# Patient Record
Sex: Female | Born: 1965
Health system: Southern US, Community
[De-identification: ages and names within clinical notes are randomized; demographics above are authoritative.]

## PROBLEM LIST (undated history)

## (undated) DIAGNOSIS — E119 Type 2 diabetes mellitus without complications: Secondary | ICD-10-CM

## (undated) DIAGNOSIS — Z309 Encounter for contraceptive management, unspecified: Secondary | ICD-10-CM

## (undated) HISTORY — DX: Type 2 diabetes mellitus without complications: E11.9

## (undated) HISTORY — DX: Encounter for contraceptive management, unspecified: Z30.9

## (undated) HISTORY — PX: NO PAST SURGERIES: SHX2092

## (undated) HISTORY — PX: FOOT SURGERY: SHX648

---

## 2001-05-21 ENCOUNTER — Other Ambulatory Visit: Admission: RE | Admit: 2001-05-21 | Discharge: 2001-05-21 | Payer: Self-pay | Admitting: Obstetrics and Gynecology

## 2006-12-28 ENCOUNTER — Ambulatory Visit (HOSPITAL_COMMUNITY): Admission: RE | Admit: 2006-12-28 | Discharge: 2006-12-28 | Payer: Self-pay | Admitting: Obstetrics and Gynecology

## 2007-08-29 ENCOUNTER — Other Ambulatory Visit: Admission: RE | Admit: 2007-08-29 | Discharge: 2007-08-29 | Payer: Self-pay | Admitting: Obstetrics and Gynecology

## 2008-01-01 ENCOUNTER — Ambulatory Visit (HOSPITAL_COMMUNITY): Admission: RE | Admit: 2008-01-01 | Discharge: 2008-01-01 | Payer: Self-pay | Admitting: Obstetrics and Gynecology

## 2008-09-08 ENCOUNTER — Other Ambulatory Visit: Admission: RE | Admit: 2008-09-08 | Discharge: 2008-09-08 | Payer: Self-pay | Admitting: Obstetrics and Gynecology

## 2009-01-02 ENCOUNTER — Ambulatory Visit (HOSPITAL_COMMUNITY): Admission: RE | Admit: 2009-01-02 | Discharge: 2009-01-02 | Payer: Self-pay | Admitting: Obstetrics and Gynecology

## 2009-09-22 ENCOUNTER — Other Ambulatory Visit: Admission: RE | Admit: 2009-09-22 | Discharge: 2009-09-22 | Payer: Self-pay | Admitting: Obstetrics and Gynecology

## 2010-01-04 ENCOUNTER — Ambulatory Visit (HOSPITAL_COMMUNITY): Admission: RE | Admit: 2010-01-04 | Discharge: 2010-01-04 | Payer: Self-pay | Admitting: Obstetrics and Gynecology

## 2010-03-28 ENCOUNTER — Emergency Department (HOSPITAL_COMMUNITY)
Admission: EM | Admit: 2010-03-28 | Discharge: 2010-03-28 | Payer: Self-pay | Source: Home / Self Care | Admitting: Family Medicine

## 2010-04-06 ENCOUNTER — Encounter
Admission: RE | Admit: 2010-04-06 | Discharge: 2010-04-06 | Payer: Self-pay | Source: Home / Self Care | Admitting: Internal Medicine

## 2010-09-23 ENCOUNTER — Ambulatory Visit (HOSPITAL_COMMUNITY)
Admission: RE | Admit: 2010-09-23 | Discharge: 2010-09-23 | Payer: Self-pay | Source: Home / Self Care | Attending: Podiatry | Admitting: Podiatry

## 2010-11-17 ENCOUNTER — Other Ambulatory Visit: Payer: Self-pay | Admitting: Adult Health

## 2010-11-17 ENCOUNTER — Other Ambulatory Visit (HOSPITAL_COMMUNITY)
Admission: RE | Admit: 2010-11-17 | Discharge: 2010-11-17 | Disposition: A | Discharge status: Home / Self Care | Payer: 59 | Source: Ambulatory Visit | Attending: Obstetrics and Gynecology | Admitting: Obstetrics and Gynecology

## 2010-11-17 DIAGNOSIS — Z01419 Encounter for gynecological examination (general) (routine) without abnormal findings: Secondary | ICD-10-CM | POA: Insufficient documentation

## 2010-11-30 ENCOUNTER — Other Ambulatory Visit: Payer: Self-pay | Admitting: Obstetrics and Gynecology

## 2010-11-30 DIAGNOSIS — Z139 Encounter for screening, unspecified: Secondary | ICD-10-CM

## 2011-01-07 ENCOUNTER — Ambulatory Visit (HOSPITAL_COMMUNITY)
Admission: RE | Admit: 2011-01-07 | Discharge: 2011-01-07 | Disposition: A | Discharge status: Home / Self Care | Payer: 59 | Source: Ambulatory Visit | Attending: Obstetrics and Gynecology | Admitting: Obstetrics and Gynecology

## 2011-01-07 DIAGNOSIS — Z1231 Encounter for screening mammogram for malignant neoplasm of breast: Secondary | ICD-10-CM | POA: Insufficient documentation

## 2011-01-07 DIAGNOSIS — Z139 Encounter for screening, unspecified: Secondary | ICD-10-CM

## 2011-11-24 ENCOUNTER — Other Ambulatory Visit (HOSPITAL_COMMUNITY)
Admission: RE | Admit: 2011-11-24 | Discharge: 2011-11-24 | Disposition: A | Discharge status: Home / Self Care | Payer: 59 | Source: Ambulatory Visit | Attending: Obstetrics and Gynecology | Admitting: Obstetrics and Gynecology

## 2011-11-24 ENCOUNTER — Other Ambulatory Visit: Payer: Self-pay | Admitting: Adult Health

## 2011-11-24 DIAGNOSIS — Z01419 Encounter for gynecological examination (general) (routine) without abnormal findings: Secondary | ICD-10-CM | POA: Insufficient documentation

## 2011-11-28 ENCOUNTER — Other Ambulatory Visit: Payer: Self-pay | Admitting: Adult Health

## 2011-11-28 DIAGNOSIS — Z139 Encounter for screening, unspecified: Secondary | ICD-10-CM

## 2012-01-09 ENCOUNTER — Ambulatory Visit (HOSPITAL_COMMUNITY)
Admission: RE | Admit: 2012-01-09 | Discharge: 2012-01-09 | Disposition: A | Discharge status: Home / Self Care | Payer: 59 | Source: Ambulatory Visit | Attending: Adult Health | Admitting: Adult Health

## 2012-01-09 DIAGNOSIS — Z139 Encounter for screening, unspecified: Secondary | ICD-10-CM

## 2012-01-09 DIAGNOSIS — Z1231 Encounter for screening mammogram for malignant neoplasm of breast: Secondary | ICD-10-CM | POA: Insufficient documentation

## 2012-11-30 ENCOUNTER — Other Ambulatory Visit: Payer: Self-pay | Admitting: Adult Health

## 2012-11-30 ENCOUNTER — Other Ambulatory Visit (HOSPITAL_COMMUNITY)
Admission: RE | Admit: 2012-11-30 | Discharge: 2012-11-30 | Disposition: A | Discharge status: Home / Self Care | Payer: 59 | Source: Ambulatory Visit | Attending: Obstetrics and Gynecology | Admitting: Obstetrics and Gynecology

## 2012-11-30 DIAGNOSIS — Z1151 Encounter for screening for human papillomavirus (HPV): Secondary | ICD-10-CM | POA: Insufficient documentation

## 2012-11-30 DIAGNOSIS — Z01419 Encounter for gynecological examination (general) (routine) without abnormal findings: Secondary | ICD-10-CM | POA: Insufficient documentation

## 2012-12-04 ENCOUNTER — Other Ambulatory Visit (HOSPITAL_COMMUNITY): Payer: Self-pay | Admitting: Internal Medicine

## 2013-01-10 ENCOUNTER — Ambulatory Visit (HOSPITAL_COMMUNITY)
Admission: RE | Admit: 2013-01-10 | Discharge: 2013-01-10 | Disposition: A | Discharge status: Home / Self Care | Payer: 59 | Source: Ambulatory Visit | Attending: Internal Medicine | Admitting: Internal Medicine

## 2013-01-10 DIAGNOSIS — Z1231 Encounter for screening mammogram for malignant neoplasm of breast: Secondary | ICD-10-CM | POA: Insufficient documentation

## 2013-01-10 DIAGNOSIS — Z139 Encounter for screening, unspecified: Secondary | ICD-10-CM

## 2013-01-21 ENCOUNTER — Telehealth: Payer: Self-pay | Admitting: Adult Health

## 2013-01-21 NOTE — Telephone Encounter (Signed)
She wanted to make sure I saw her mammogram done in Feb. It was sent to Dr. Ouida Sills,

## 2013-10-07 ENCOUNTER — Other Ambulatory Visit: Payer: Self-pay | Admitting: Adult Health

## 2013-12-09 ENCOUNTER — Other Ambulatory Visit: Payer: Self-pay | Admitting: Adult Health

## 2013-12-09 DIAGNOSIS — Z1231 Encounter for screening mammogram for malignant neoplasm of breast: Secondary | ICD-10-CM

## 2014-01-02 ENCOUNTER — Telehealth: Payer: Self-pay

## 2014-01-02 ENCOUNTER — Telehealth: Payer: Self-pay | Admitting: *Deleted

## 2014-01-02 MED ORDER — FLUCONAZOLE 150 MG PO TABS: 150.0000 mg | ORAL_TABLET | Freq: Once | ORAL | Status: DC

## 2014-01-02 NOTE — Telephone Encounter (Signed)
Has appt in April will ant labs later

## 2014-01-02 NOTE — Telephone Encounter (Signed)
?  yeast after antibiotics requests diflucan

## 2014-01-13 ENCOUNTER — Ambulatory Visit (HOSPITAL_COMMUNITY)
Admission: RE | Admit: 2014-01-13 | Discharge: 2014-01-13 | Disposition: A | Discharge status: Home / Self Care | Payer: 59 | Source: Ambulatory Visit | Attending: Adult Health | Admitting: Adult Health

## 2014-01-13 DIAGNOSIS — Z1231 Encounter for screening mammogram for malignant neoplasm of breast: Secondary | ICD-10-CM

## 2014-01-23 ENCOUNTER — Other Ambulatory Visit: Payer: Self-pay | Admitting: Adult Health

## 2014-01-23 ENCOUNTER — Encounter: Payer: Self-pay | Admitting: Adult Health

## 2014-01-23 ENCOUNTER — Ambulatory Visit (INDEPENDENT_AMBULATORY_CARE_PROVIDER_SITE_OTHER): Payer: 59 | Admitting: Adult Health

## 2014-01-23 VITALS — BP 140/82 | HR 74 | Ht 63.0 in | Wt 127.0 lb

## 2014-01-23 DIAGNOSIS — Z309 Encounter for contraceptive management, unspecified: Secondary | ICD-10-CM

## 2014-01-23 DIAGNOSIS — Z01419 Encounter for gynecological examination (general) (routine) without abnormal findings: Secondary | ICD-10-CM

## 2014-01-23 DIAGNOSIS — Z1212 Encounter for screening for malignant neoplasm of rectum: Secondary | ICD-10-CM

## 2014-01-23 HISTORY — DX: Encounter for contraceptive management, unspecified: Z30.9

## 2014-01-23 LAB — HEMOCCULT GUIAC POC 1CARD (OFFICE): Fecal Occult Blood, POC: NEGATIVE

## 2014-01-23 MED ORDER — NORGESTREL-ETHINYL ESTRADIOL 0.5-50 MG-MCG PO TABS: ORAL_TABLET | ORAL | Status: DC

## 2014-01-23 NOTE — Patient Instructions (Signed)
Physical in 1 year Pap in 2017 Mammogram yearly Colonoscopy at 51 Labs per PCP

## 2014-01-23 NOTE — Progress Notes (Signed)
Patient ID: Shannon Miles, female   DOB: March 19, 1966, 48 y.o.   MRN: [id number] History of Present Illness: Shannon Miles is a 48 year old white female, married in for a physical.She had a normal pap with negative  HPV 11/30/12.She works for [employer] but has changed jobs and not loving it, says she will retire at 3655 from there.   Current Medications, Allergies, Past Medical History, Past Surgical History, Family History and Social History were reviewed in [facility] electronic medical record.     Review of Systems: Patient denies any headaches, blurred vision, shortness of breath, chest pain, abdominal pain, problems with bowel movements, urination, or intercourse. No joint pain or mood swings.Periods are light and last about 1 day.   Physical Exam:BP 140/82  Pulse 74  Ht 5\' 3"  (1.6 m)  Wt 127 lb (57.607 kg)  BMI 22.50 kg/m2  LMP 01/06/2014 General:  Well developed, well nourished, no acute distress Skin:  Warm and dry Neck:  Midline trachea, normal thyroid Lungs; Clear to auscultation bilaterally Breast:  No dominant palpable mass, retraction, or nipple discharge Cardiovascular: Regular rate and rhythm Abdomen:  Soft, non tender, no hepatosplenomegaly Pelvic:  External genitalia is normal in appearance.  The vagina is normal in appearance. The cervix is bulbous.  Uterus is felt to be normal size, shape, and contour.  No adnexal masses or tenderness noted. Rectal: Good sphincter tone, no polyps, or hemorrhoids felt.  Hemoccult negative. Extremities:  No swelling or varicosities noted Psych:  No mood changes, alert and cooperative, seems happy Discussed can stay on OCs til 53 will stop and be off 1 month then check FSH.  Impression: Yearly gyn exam no pap Contraceptive management    Plan: Physical in 1 year Pap in 2017 Mammogram yearly  Colonoscopy at 51  Labs per PCP Refilled Ogestrel x 1 year

## 2014-01-31 ENCOUNTER — Other Ambulatory Visit: Payer: Self-pay | Admitting: Adult Health

## 2014-05-09 ENCOUNTER — Ambulatory Visit (HOSPITAL_COMMUNITY)
Admission: RE | Admit: 2014-05-09 | Discharge: 2014-05-09 | Disposition: A | Discharge status: Home / Self Care | Payer: 59 | Source: Ambulatory Visit | Attending: Internal Medicine | Admitting: Internal Medicine

## 2014-05-09 DIAGNOSIS — M6281 Muscle weakness (generalized): Secondary | ICD-10-CM | POA: Diagnosis not present

## 2014-05-09 DIAGNOSIS — G5700 Lesion of sciatic nerve, unspecified lower limb: Secondary | ICD-10-CM | POA: Insufficient documentation

## 2014-05-09 DIAGNOSIS — M25559 Pain in unspecified hip: Secondary | ICD-10-CM | POA: Insufficient documentation

## 2014-05-09 DIAGNOSIS — M25659 Stiffness of unspecified hip, not elsewhere classified: Secondary | ICD-10-CM | POA: Diagnosis not present

## 2014-05-09 DIAGNOSIS — M5430 Sciatica, unspecified side: Secondary | ICD-10-CM | POA: Diagnosis not present

## 2014-05-09 DIAGNOSIS — R269 Unspecified abnormalities of gait and mobility: Secondary | ICD-10-CM | POA: Insufficient documentation

## 2014-05-09 DIAGNOSIS — IMO0001 Reserved for inherently not codable concepts without codable children: Secondary | ICD-10-CM | POA: Diagnosis present

## 2014-05-09 DIAGNOSIS — M25652 Stiffness of left hip, not elsewhere classified: Secondary | ICD-10-CM

## 2014-05-09 DIAGNOSIS — G5702 Lesion of sciatic nerve, left lower limb: Secondary | ICD-10-CM | POA: Insufficient documentation

## 2014-05-09 DIAGNOSIS — M25552 Pain in left hip: Secondary | ICD-10-CM

## 2014-05-09 DIAGNOSIS — M5432 Sciatica, left side: Secondary | ICD-10-CM

## 2014-05-09 NOTE — Evaluation (Signed)
Physical Therapy Evaluation  Patient Details  Name: Shannon RabonMelissa S Miles MRN: [id number] Date of Birth: 1966/08/08  Today's Date: 05/09/2014 Time: [id number] PT Time Calculation (min): 45 min    Charges: 1 Eval, Manual 1625-1633, TherEx [id number]          Visit#: 1 of 17  Re-eval: 06/08/14 Assessment Diagnosis: Sciatica, possible piriformis syndrome Next MD Visit: Ouida SillsFagan:  Prior Therapy: no  Authorization: [employer]    Authorization Time Period:    Authorization Visit#:   of     Past Medical History:  Past Medical History  Diagnosis Date  . Diabetes mellitus without complication   . Contraceptive management 01/23/2014   Past Surgical History: No past surgical history on file.  Subjective Symptoms/Limitations Symptoms: numbness and tingling in Lt LE occasionally most with exercisesw Pertinent History: puuling a muscle in her hip and feelign a pop, 1 year ago, patient was bit by dog. Patient has most troubkle with performing pilates and treadmill. Diabtes: controlled with diet and exercise. Patient recently order ellyptical Limitations: Sitting;Walking;Standing How long can you sit comfortably?: <3610minutes How long can you walk comfortably?: no difficulty Patient Stated Goals: To be able to run and do pilates without pain Pain Assessment Currently in Pain?: Yes Pain Score: 1  Pain Location: Hip Pain Orientation: Posterior;Distal Pain Type: Chronic pain Pain Radiating Towards: From Lt hip to Lt foot Pain Onset: More than a month ago Pain Frequency: Intermittent Pain Relieving Factors: decreasing inensity of exercise, decreasing grade of treadmilltaken  Effect of Pain on Daily Activities: Running, pilates, and sittign at work  Cognition/Observation Observation/Other Assessments Observations: Gait: excessive toe out, good foot mechanics, possible early heel rise Other Assessments: 3D hip Excursion: tightness with Lt frontal plane sway., and limited hip  extension  Sensation/Coordination/Flexibility/Functional Tests Flexibility Thomas: Positive Obers: Positive  Assessment LLE AROM (degrees) Left Hip External Rotation : 44 Left Hip Internal Rotation : 40 Left Ankle Dorsiflexion: 9 (12 Rt) LLE Strength Left Hip Flexion: 5/5 Left Hip Extension: 4/5 Left Hip External Rotation : 44 Left Hip Internal Rotation : 40 Left Hip ABduction: 4/5 Left Knee Flexion: 3+/5 (painful) Left Knee Extension: 5/5 Left Ankle Dorsiflexion: 5/5  Exercise/Treatments Mobility/Balance  Ambulation/Gait Curb: 7: Independent   Stretches Piriformis stretch 4x 15seconds 3 ways ITband stretch 5x 10 secondsto 6" step   other manual therapy: neural flossing of sciatic nerve  Physical Therapy Assessment and Plan PT Assessment and Plan Clinical Impression Statement: Patient displays signs and symptoms consistent with Lt LE sciatica and piriformis syndrome includign Lt LE hip weakness, and stiffness, and limited piriformis muscle mobility resulting in abnormal gait while walkign and running. Patient displayed decreased pain following neural flossing technique and piriformis muscle stretchresulting in improved pain while sitting. patient  will benefit from skilled phsyial therapy to restor normal hip mobility and strength so patient can return to work with less pain and regular running routine to improve her control oftype 2 diabetes.  Pt will benefit from skilled therapeutic intervention in order to improve on the following deficits: Abnormal gait;Decreased activity tolerance;Decreased strength;Impaired flexibility;Pain;Increased muscle spasms;Increased fascial restricitons Rehab Potential: Good Clinical Impairments Affecting Rehab Potential: highly motivated, recent injury PT Frequency: Min 2X/week PT Duration: 8 weeks PT Treatment/Interventions: Gait training;Stair training;Therapeutic exercise;Balance training;Modalities;Manual techniques;Patient/family  education PT Plan: initial focus on restoring mobility of hamstrings, piriformis, tensor fascia latae, and rectus femoris/hip flexors to normalize gait/posture. As pain and mobnility improves therapy to shift focus to increasing LE strength and stability so patient can return to runnign  pain free.     Goals Home Exercise Program Pt/caregiver will Perform Home Exercise Program: For increased ROM;For increased strengthening;Independently PT Goal: Perform Home Exercise Program - Progress: Goal set today PT Short Term Goals Time to Complete Short Term Goals: 4 weeks PT Short Term Goal 1: Patient will display a negative thomas test indicating improved stride length durign walking and ruunning PT Short Term Goal 2: Patient will display 20 degrees ankle dorsiflexion indicating decreased early heel off during gait. PT Short Term Goal 3: Patient will display a negative obers test indicating increased tensor fascia latae and glut med/min mobility PT Short Term Goal 4: Patient will display a negative piriformis test indicating imptoved moscle mobility and decreased strain on sciatic nerve PT Short Term Goal 5: Patient will diisplay a negative neural tension test for sciative nerve indicating improved nervsous tissue mobility so patient can walk and sit >1 hour without pain.  PT Long Term Goals Time to Complete Long Term Goals: 8 weeks PT Long Term Goal 1: Patient will display  improved glut med/max strengthe of 5/5 MMt indicatign improved hip strength and stability so patient can return to running.  PT Long Term Goal 2: Patient will display increased hamstring strength of 5/5 manual muscle test so patient can return to running >79miles per workout Long Term Goal 3: Patient will demosntrate equal frontal plane single leg balance reqaches withing 5cm indicaqtign improved hip, kne and ankle stability on Lt vs Rt.   Problem List Patient Active Problem List   Diagnosis Date Noted  . Sciatica 05/09/2014  .  Piriformis syndrome of left side 05/09/2014  . Stiffness of joint of left pelvic region and thigh 05/09/2014  . Pain in joint, pelvic region and thigh 05/09/2014  . Contraceptive management 01/23/2014    PT - End of Session Activity Tolerance: Patient tolerated treatment well General Behavior During Therapy: [facility] for tasks assessed/performed PT Plan of Care PT Home Exercise Plan: Piriformis and ITband stretches4x 15 seconds  GP    Price Lachapelle R 05/09/2014, 8:05 PM  Physician Documentation Your signature is required to indicate approval of the treatment plan as stated above.  Please sign and either send electronically or make a copy of this report for your files and return this physician signed original.   Please mark one 1.__approve of plan  2. ___approve of plan with the following conditions.   ______________________________                                                          _____________________ Physician Signature                                                                                                             Date

## 2014-05-27 ENCOUNTER — Ambulatory Visit (HOSPITAL_COMMUNITY): Payer: 59 | Admitting: Physical Therapy

## 2014-05-27 ENCOUNTER — Ambulatory Visit (HOSPITAL_COMMUNITY)
Admission: RE | Admit: 2014-05-27 | Discharge: 2014-05-27 | Disposition: A | Discharge status: Home / Self Care | Payer: 59 | Source: Ambulatory Visit | Attending: Internal Medicine | Admitting: Internal Medicine

## 2014-05-27 DIAGNOSIS — IMO0001 Reserved for inherently not codable concepts without codable children: Secondary | ICD-10-CM | POA: Diagnosis present

## 2014-05-27 DIAGNOSIS — M5430 Sciatica, unspecified side: Secondary | ICD-10-CM | POA: Insufficient documentation

## 2014-05-27 DIAGNOSIS — M25659 Stiffness of unspecified hip, not elsewhere classified: Secondary | ICD-10-CM | POA: Diagnosis not present

## 2014-05-27 DIAGNOSIS — G5700 Lesion of sciatic nerve, unspecified lower limb: Secondary | ICD-10-CM | POA: Insufficient documentation

## 2014-05-27 DIAGNOSIS — M25559 Pain in unspecified hip: Secondary | ICD-10-CM | POA: Diagnosis not present

## 2014-05-27 DIAGNOSIS — M6281 Muscle weakness (generalized): Secondary | ICD-10-CM | POA: Insufficient documentation

## 2014-05-27 DIAGNOSIS — R269 Unspecified abnormalities of gait and mobility: Secondary | ICD-10-CM | POA: Insufficient documentation

## 2014-05-27 NOTE — Progress Notes (Signed)
Physical Therapy Treatment Patient Details  Name: Shannon Miles MRN: [id number] Date of Birth: 02/26/1966  Today's Date: 05/27/2014 Time: 4098-1191 PT Time Calculation (min): 40 min  Charges: TherEx [id number] Visit#: 2 of 17  Re-eval: 06/08/14 Assessment Diagnosis: Sciatica, possible piriformis syndrome Next MD Visit: Fagan:  Prior Therapy: no  Authorization: [employer]   Subjective: Symptoms/Limitations Symptoms: Patient5 reports feeling much better noting that she no longer feels as tight and has much less pain. notes contionuned pain only with prolonged sitting and no longer having pain during any pilates exercises  Pain Assessment Currently in Pain?: No/denies Pain Score: 0-No pain  Exercise/Treatments Stretches Active Hamstring Stretch: Limitations Active Hamstring Stretch Limitations: 10x 3 seconds to 14" box Hip Flexor Stretch: Limitations Hip Flexor Stretch Limitations: 14" 3 way 10x 3" ITB Stretch: Limitations ITB Stretch Limitations: 10x 3" to 8" box Piriformis Stretch: Limitations Piriformis Stretch Limitations: 10x 3' Gastroc Stretch: Limitations Gastroc Stretch Limitations: 10x 3"  Standing Functional Squat: Limitations Functional Squat Limitations: SFT squat, no frontal plane tweak 5x each Other Standing Knee Exercises: 2 way groin stretch 10x 3 seconds Other Standing Knee Exercises: Squat walk around 10x   Physical Therapy Assessment and Plan PT Assessment and Plan Clinical Impression Statement: Patient demsontrats good technique with all stretches requiring mnimal cuing . stretches added to HEP.  Initiated low level strengtheing with patient displaying no pain.   PT Plan: continue focus on restoring mobility of hamstrings, piriformis, tensor fascia latae, and rectus femoris/hip flexors to normalize gait/posture and slow introduction of strengtheing exercises    Goals PT Short Term Goals PT Short Term Goal 1: Patient will display a negative thomas test  indicating improved stride length durign walking and ruunning PT Short Term Goal 1 - Progress: Progressing toward goal PT Short Term Goal 2: Patient will display 20 degrees ankle dorsiflexion indicating decreased early heel off during gait. PT Short Term Goal 2 - Progress: Progressing toward goal PT Short Term Goal 3: Patient will display a negative obers test indicating increased tensor fascia latae and glut med/min mobility PT Short Term Goal 3 - Progress: Progressing toward goal PT Short Term Goal 4: Patient will display a negative piriformis test indicating imptoved moscle mobility and decreased strain on sciatic nerve PT Short Term Goal 4 - Progress: Progressing toward goal PT Short Term Goal 5: Patient will diisplay a negative neural tension test for sciative nerve indicating improved nervsous tissue mobility so patient can walk and sit >1 hour without pain.  PT Short Term Goal 5 - Progress: Progressing toward goal PT Long Term Goals PT Long Term Goal 1: Patient will display  improved glut med/max strengthe of 5/5 MMt indicatign improved hip strength and stability so patient can return to running.  PT Long Term Goal 1 - Progress: Progressing toward goal PT Long Term Goal 2: Patient will display increased hamstring strength of 5/5 manual muscle test so patient can return to running >63miles per workout PT Long Term Goal 2 - Progress: Progressing toward goal Long Term Goal 3: Patient will demosntrate equal frontal plane single leg balance reqaches withing 5cm indicating improved hip, kne and ankle stability on Lt vs Rt.  Long Term Goal 3 Progress: Progressing toward goal  Problem List Patient Active Problem List   Diagnosis Date Noted  . Sciatica 05/09/2014  . Piriformis syndrome of left side 05/09/2014  . Stiffness of joint of left pelvic region and thigh 05/09/2014  . Pain in joint, pelvic region and thigh 05/09/2014  .  Contraceptive management 01/23/2014    PT - End of  Session Activity Tolerance: Patient tolerated treatment well General Behavior During Therapy: [facility] for tasks assessed/performed  GP    Uva Runkel R 05/27/2014, 4:53 PM

## 2014-05-29 ENCOUNTER — Ambulatory Visit (HOSPITAL_COMMUNITY)
Admission: RE | Admit: 2014-05-29 | Discharge: 2014-05-29 | Disposition: A | Discharge status: Home / Self Care | Payer: 59 | Source: Ambulatory Visit | Attending: Internal Medicine | Admitting: Internal Medicine

## 2014-05-29 DIAGNOSIS — IMO0001 Reserved for inherently not codable concepts without codable children: Secondary | ICD-10-CM | POA: Diagnosis not present

## 2014-05-29 NOTE — Progress Notes (Addendum)
Physical Therapy Treatment Patient Details  Name: Shannon Miles MRN: [id number] Date of Birth: 01/07/1966  Today's Date: 05/29/2014 Time:  1600- 1645      Chargees: TherEx [id number], Manual [id number] Visit#: 3 of 17  Re-eval: 06/08/14 Assessment Diagnosis: Sciatica, possible piriformis syndrome Next MD Visit: Fagan:  Prior Therapy: no  Subjective: Symptoms/Limitations Symptoms: Patient reports HEP adhearance no pain and is feeling good.  Pain Assessment Currently in Pain?: No/denies  Exercise/Treatments Stretches Active Hamstring Stretch: Limitations Active Hamstring Stretch Limitations: 5x 3 seconds to 14" box Hip Flexor Stretch: Limitations Hip Flexor Stretch Limitations: 14" 3 way 5x 3" ITB Stretch: Limitations ITB Stretch Limitations: 10x 3" to 8" box Piriformis Stretch: Limitations Piriformis Stretch Limitations: 3way 10x 3' Gastroc Stretch: Limitations Gastroc Stretch Limitations: 5x 3"  Standing Forward Lunges: Limitations Forward Lunges Limitations: Lunge matrix 5x  Functional Squat Limitations: SFT squat, no frontal plane tweak 5x each Other Standing Knee Exercises: Sumo walk with red t-band 415ft 2x round trib Other Standing Knee Exercises: Squat walk around 10x  Manual Therapy Manual Therapy: Myofascial release Myofascial Release: piriformis release  Physical Therapy Assessment and Plan PT Assessment and Plan Clinical Impression Statement: Patient demsontrates good technique with all stretches requiring minimal cuing. Patient demonstrates good tolerance to progressed strengthening exercises. no pain noted throughout session. Manual therapy performed this session to ffurther progress pirifomris muscle mobility with patient stating "that felt great!" PT Plan: continue focus on restoring mobility of hamstrings, piriformis, tensor fascia latae, and rectus femoris/hip flexors to normalize gait/posture and slow introduction of strengtheing exercises. Add 3D step  ups and single leg balance reach matrix    Problem List Patient Active Problem List   Diagnosis Date Noted  . Sciatica 05/09/2014  . Piriformis syndrome of left side 05/09/2014  . Stiffness of joint of left pelvic region and thigh 05/09/2014  . Pain in joint, pelvic region and thigh 05/09/2014  . Contraceptive management 01/23/2014    PT - End of Session Activity Tolerance: Patient tolerated treatment well General Behavior During Therapy: [facility] for tasks assessed/performed PT Plan of Care PT Home Exercise Plan: Piriformis and ITband stretches 10x 3seconds and squat walk around daily, hip flexor, calf, hamstring, groin every other day 10x 3seconds, SFT squat nuetral frontal plane 5x, lunge matrix 5x, sumo walk with blue T-band 3240ft each everyother day  GP    Latitia Housewright R 05/29/2014, 5:52 PM

## 2014-06-03 ENCOUNTER — Ambulatory Visit (HOSPITAL_COMMUNITY): Payer: 59 | Admitting: Physical Therapy

## 2014-06-03 ENCOUNTER — Ambulatory Visit (HOSPITAL_COMMUNITY)
Admission: RE | Admit: 2014-06-03 | Discharge: 2014-06-03 | Disposition: A | Discharge status: Home / Self Care | Payer: 59 | Source: Ambulatory Visit | Attending: Internal Medicine | Admitting: Internal Medicine

## 2014-06-03 DIAGNOSIS — IMO0001 Reserved for inherently not codable concepts without codable children: Secondary | ICD-10-CM | POA: Diagnosis not present

## 2014-06-03 NOTE — Progress Notes (Signed)
Physical Therapy Treatment Patient Details  Name: Shannon Miles MRN: [id number] Date of Birth: [birth year]  Today's Date: 06/03/2014 Time: 4098-1191 PT Time Calculation (min): 45 min   Charges: [id number] TE Visit#: 4 of 17  Re-eval: 06/08/14 Assessment Diagnosis: Sciatica, possible piriformis syndrome Next MD Visit: Ouida Sills:  Prior Therapy: no  Authorization: [employer]  Subjective: Symptoms/Limitations Symptoms: Patient states increased soreness after last session, no pain following.  Pain Assessment Currently in Pain?: No/denies  Exercise/Treatments Stretches Active Hamstring Stretch: Limitations Active Hamstring Stretch Limitations: 5x 3 seconds to 14" box Hip Flexor Stretch: Limitations Hip Flexor Stretch Limitations: 14" 3 way 5x 3" ITB Stretch: Limitations ITB Stretch Limitations: 10x 3" to 8" box Piriformis Stretch: Limitations Piriformis Stretch Limitations: 3way 10x 3' Gastroc Stretch: Limitations Gastroc Stretch Limitations: 5x 3"  Standing Forward Lunges: Limitations Forward Lunges Limitations: Lunge matrix common and uncommon 5x  Lateral Step Up: Limitations;Both;10 reps Lateral Step Up Limitations: 12" Forward Step Up: Both;Limitations;10 reps Forward Step Up Limitations: 12" Functional Squat Limitations: SFT squat, no frontal plane tweak 5x each 5lb dumbbell SLS: Single leg balance reach matrix common 5x Other Standing Knee Exercises: Sumo walk with green t-band 15ft 2x round trib Other Standing Knee Exercises: Squat walk around 10x   Physical Therapy Assessment and Plan PT Assessment and Plan Clinical Impression Statement: Patient is making great progress, bot contineud to demosntrate glut weakness on LT LE greater that Rt as indicateed by decreased stability durign single leg balance reach activity. no pain throughout session. Noted limited depth throughtou lunge matrix due to functional weakness. PT Plan: continue focus on restoring mobility of hamstrings,  piriformis, tensor fascia latae, and rectus femoris/hip flexors to normalize gait/posture and slow introduction of strengtheing exercises. Add 3D step ups and single leg balance reach matrix    Goals PT Short Term Goals PT Short Term Goal 1: Patient will display a negative thomas test indicating improved stride length durign walking and ruunning PT Short Term Goal 2: Patient will display 20 degrees ankle dorsiflexion indicating decreased early heel off during gait. PT Short Term Goal 2 - Progress: Progressing toward goal PT Short Term Goal 3: Patient will display a negative obers test indicating increased tensor fascia latae and glut med/min mobility PT Short Term Goal 3 - Progress: Progressing toward goal PT Short Term Goal 4: Patient will display a negative piriformis test indicating imptoved moscle mobility and decreased strain on sciatic nerve PT Short Term Goal 4 - Progress: Progressing toward goal PT Short Term Goal 5: Patient will diisplay a negative neural tension test for sciative nerve indicating improved nervsous tissue mobility so patient can walk and sit >1 hour without pain.  PT Short Term Goal 5 - Progress: Progressing toward goal  Problem List Patient Active Problem List   Diagnosis Date Noted  . Sciatica 05/09/2014  . Piriformis syndrome of left side 05/09/2014  . Stiffness of joint of left pelvic region and thigh 05/09/2014  . Pain in joint, pelvic region and thigh 05/09/2014  . Contraceptive management 01/23/2014    PT - End of Session Activity Tolerance: Patient tolerated treatment well General Behavior During Therapy: [facility] for tasks assessed/performed  GP    Yuriy Cui R 06/03/2014, 4:57 PM

## 2014-06-05 ENCOUNTER — Ambulatory Visit (HOSPITAL_COMMUNITY)
Admission: RE | Admit: 2014-06-05 | Discharge: 2014-06-05 | Disposition: A | Discharge status: Home / Self Care | Payer: 59 | Source: Ambulatory Visit | Attending: Internal Medicine | Admitting: Internal Medicine

## 2014-06-05 ENCOUNTER — Ambulatory Visit (HOSPITAL_COMMUNITY): Payer: 59 | Admitting: Physical Therapy

## 2014-06-05 DIAGNOSIS — IMO0001 Reserved for inherently not codable concepts without codable children: Secondary | ICD-10-CM | POA: Diagnosis not present

## 2014-06-05 NOTE — Progress Notes (Signed)
Physical Therapy Treatment Patient Details  Name: Shannon Miles MRN: [id number] Date of Birth: 10/12/1965  Today's Date: 06/05/2014 Time: 4098-1191 PT Time Calculation (min): 45 min    Charges: TE [id number] Visit#: 5 of 17  Re-eval: 06/08/14 Assessment Diagnosis: Sciatica, possible piriformis syndrome Next MD Visit: Ouida Sills:  Prior Therapy: no  Authorization: [employer]   Subjective: Symptoms/Limitations Symptoms: Patient styates she is feeling much bettrer, and increased the grade on her treadmill to 5 degrees noting no pain what so ever. Patient states feeling really good and loose following her run berfore therapy Pain Assessment Currently in Pain?: No/denies Pain Score: 0-No pain  Exercise/Treatments Stretches Active Hamstring Stretch: Limitations Active Hamstring Stretch Limitations: 5x 3 seconds to 14" box ITB Stretch: Limitations ITB Stretch Limitations: 10x 3" to 8" box Piriformis Stretch: Limitations Piriformis Stretch Limitations: 3way 10x 3' Gastroc Stretch: Limitations Gastroc Stretch Limitations: 5x 3"  Standing Forward Lunges: Limitations Forward Lunges Limitations: Lunge matrix common and uncommon 5x ith 5lb dumbbell Lateral Step Up: Limitations;Both;10 reps Lateral Step Up Limitations: 12" 10x also transverse plane step up 10x Forward Step Up: Both;Limitations;10 reps Forward Step Up Limitations: 12" Functional Squat Limitations: single leg toe touch squat reach matrix 5x with 5lb dumbbell to 18"  SLS: Single leg balance reach matrix 5x from 2" Other Standing Knee Exercises: Sumo walk and monster walk  with green t-band 36ft 2x round trib    Physical Therapy Assessment and Plan PT Assessment and Plan Clinical Impression Statement: Patient conitnues to progress well with improving strength and ability to performd squat/lunge/step ups with improved control through increased depth. o pain throughtou session. Patient conitnues to have limited depth with lunging  though depth has improved.  PT Plan: continue focus on restoring mobility piriformis, and tensor fascia latae to normalize gait/posture, progress lunges to do from a 2" box, increase step up height, introduce 6" retro 2way chop squat, introduece speed warm up introduce UE and LE ground matrix    Goals PT Short Term Goals PT Short Term Goal 1: Patient will display a negative thomas test indicating improved stride length durign walking and ruunning PT Short Term Goal 1 - Progress: Progressing toward goal PT Short Term Goal 2: Patient will display 20 degrees ankle dorsiflexion indicating decreased early heel off during gait. PT Short Term Goal 2 - Progress: Progressing toward goal PT Short Term Goal 3: Patient will display a negative obers test indicating increased tensor fascia latae and glut med/min mobility PT Short Term Goal 3 - Progress: Progressing toward goal PT Short Term Goal 4: Patient will display a negative piriformis test indicating imptoved moscle mobility and decreased strain on sciatic nerve PT Short Term Goal 4 - Progress: Progressing toward goal PT Short Term Goal 5: Patient will diisplay a negative neural tension test for sciative nerve indicating improved nervsous tissue mobility so patient can walk and sit >1 hour without pain.  PT Short Term Goal 5 - Progress: Progressing toward goal  Problem List Patient Active Problem List   Diagnosis Date Noted  . Sciatica 05/09/2014  . Piriformis syndrome of left side 05/09/2014  . Stiffness of joint of left pelvic region and thigh 05/09/2014  . Pain in joint, pelvic region and thigh 05/09/2014  . Contraceptive management 01/23/2014    PT - End of Session Activity Tolerance: Patient tolerated treatment well General Behavior During Therapy: [facility] for tasks assessed/performed  GP    Divonte Senger R 06/05/2014, 5:07 PM

## 2014-06-10 ENCOUNTER — Ambulatory Visit (HOSPITAL_COMMUNITY): Payer: 59 | Admitting: Physical Therapy

## 2014-06-10 ENCOUNTER — Ambulatory Visit (HOSPITAL_COMMUNITY)
Admission: RE | Admit: 2014-06-10 | Discharge: 2014-06-10 | Disposition: A | Discharge status: Home / Self Care | Payer: 59 | Source: Ambulatory Visit | Attending: Internal Medicine | Admitting: Internal Medicine

## 2014-06-10 DIAGNOSIS — M25659 Stiffness of unspecified hip, not elsewhere classified: Secondary | ICD-10-CM | POA: Diagnosis not present

## 2014-06-10 DIAGNOSIS — R269 Unspecified abnormalities of gait and mobility: Secondary | ICD-10-CM | POA: Diagnosis not present

## 2014-06-10 DIAGNOSIS — M5430 Sciatica, unspecified side: Secondary | ICD-10-CM | POA: Diagnosis not present

## 2014-06-10 DIAGNOSIS — M25559 Pain in unspecified hip: Secondary | ICD-10-CM | POA: Insufficient documentation

## 2014-06-10 DIAGNOSIS — M6281 Muscle weakness (generalized): Secondary | ICD-10-CM | POA: Diagnosis not present

## 2014-06-10 DIAGNOSIS — G5700 Lesion of sciatic nerve, unspecified lower limb: Secondary | ICD-10-CM | POA: Insufficient documentation

## 2014-06-10 DIAGNOSIS — IMO0001 Reserved for inherently not codable concepts without codable children: Secondary | ICD-10-CM | POA: Diagnosis not present

## 2014-06-10 NOTE — Evaluation (Addendum)
Physical Therapy Re-assesment  Patient Details  Name: Shannon Miles MRN: [id number] Date of Birth: 04-Apr-1966  Today's Date: 06/10/2014 Time: 3295-1884 PT Time Calculation (min): 45 min     Charges: 1660-6301 TE         Visit#: 6 of 17  Re-eval: 07/10/14 Assessment Diagnosis: Sciatica, possible piriformis syndrome Next MD Visit: Willey Blade:  Prior Therapy: no  Authorization: [employer]     Past Medical History:  Past Medical History  Diagnosis Date  . Diabetes mellitus without complication   . Contraceptive management 01/23/2014   Past Surgical History: No past surgical history on file.  Subjective Symptoms/Limitations Symptoms: Patient states she has conitnued to improve with increase grade on treadmill and is running on hills without difficulty Pertinent History: puuling a muscle in her hip and feeling a pop, 1 year ago, patient was bit by dog. Patient has most troubkle with performing pilates and treadmill. Diabtes: controlled with diet and exercise. Patient recently order ellyptical Pain Assessment Currently in Pain?: No/denies Pain Score: 0-No pain  Sensation/Coordination/Flexibility/Functional Tests Flexibility Thomas: Negative Obers: Negative Functional Tests Functional Tests: FOTO: was 12% limited, now 2% limited   Assessment LLE AROM (degrees) Left Ankle Dorsiflexion: 20 (12 Rt) LLE Strength Left Hip Extension: 4/5 Left Hip ABduction: 4/5 Left Knee Flexion:  (4+/5)  Exercise/Treatments Stretches Active Hamstring Stretch: Limitations Active Hamstring Stretch Limitations: 5x 3 seconds to 8" box 3D ITB Stretch: Limitations ITB Stretch Limitations: 10x 3" to 8" box Piriformis Stretch: Limitations Piriformis Stretch Limitations: 3way 10x 3' Gastroc Stretch: Limitations Gastroc Stretch Limitations: 5x 3"  Standing Functional Squat Limitations: 12" retro single leg toe touch squat 2way chop with Blue ball SLS: Single leg balance reach matrix 5x from 2" Other  Standing Knee Exercises: UE and LE ground matrix 5x  Physical Therapy Assessment and Plan PT Assessment and Plan Clinical Impression Statement: Patient is making great progress towards all goals with only remainign deficits being limited glut med/max strength. Patient displays significantly improved mobility in bilateral LE with improved gait. Expect patient to conitnue to benefit from 2 more weeks of phsyical therapy to reach remaining long term goals then discharge with HEP to further progress running.  PT Plan: continue focus on restoring mobility piriformis, and tensor fascia latae to normalize gait/posture, progress lunges to do from a 2" box, increase step up height. Introduce speed warm-up.    Goals PT Short Term Goals PT Short Term Goal 1: Patient will display a negative thomas test indicating improved stride length durign walking and ruunning PT Short Term Goal 1 - Progress: Met PT Short Term Goal 2: Patient will display 20 degrees ankle dorsiflexion indicating decreased early heel off during gait. PT Short Term Goal 2 - Progress: Met PT Short Term Goal 3: Patient will display a negative obers test indicating increased tensor fascia latae and glut med/min mobility PT Short Term Goal 3 - Progress: Met PT Short Term Goal 4: Patient will display a negative piriformis test indicating imptoved moscle mobility and decreased strain on sciatic nerve PT Short Term Goal 4 - Progress: Met PT Short Term Goal 5: Patient will diisplay a negative neural tension test for sciative nerve indicating improved nervsous tissue mobility so patient can walk and sit >1 hour without pain.  PT Short Term Goal 5 - Progress: Met PT Long Term Goals PT Long Term Goal 1: Patient will display  improved glut med/max strengthe of 5/5 MMt indicatign improved hip strength and stability so patient can return to running.  PT Long Term Goal 1 - Progress: Progressing toward goal PT Long Term Goal 2: Patient will display  increased hamstring strength of 5/5 manual muscle test so patient can return to running >70mles per workout PT Long Term Goal 2 - Progress: Progressing toward goal Long Term Goal 3: Patient will demosntrate equal frontal plane single leg balance reqaches withing 5cm indicating improved hip, kne and ankle stability on Lt vs Rt.  Long Term Goal 3 Progress: Progressing toward goal  Problem List Patient Active Problem List   Diagnosis Date Noted  . Sciatica 05/09/2014  . Piriformis syndrome of left side 05/09/2014  . Stiffness of joint of left pelvic region and thigh 05/09/2014  . Pain in joint, pelvic region and thigh 05/09/2014  . Contraceptive management 01/23/2014    PT - End of Session Activity Tolerance: Patient tolerated treatment well General Behavior During Therapy: WFL for tasks assessed/performed PT Plan of Care PT Home Exercise Plan: Piriformis and ITband stretches 10x 3seconds and squat walk around daily, hip flexor, calf, hamstring, groin every other day 10x 3seconds, SFT squat nuetral frontal plane 5x, lunge matrix 5x, sumo walk with blue T-band 444feach everyother day  GP    [name] R 06/10/2014, 5:04 PM  Physician Documentation Your signature is required to indicate approval of the treatment plan as stated above.  Please sign and either send electronically or make a copy of this report for your files and return this physician signed original.   Please mark one 1.__approve of plan  2. ___approve of plan with the following conditions.   ______________________________                                                          _____________________ Physician Signature                                                                                                             Date

## 2014-06-12 ENCOUNTER — Ambulatory Visit (HOSPITAL_COMMUNITY): Payer: 59 | Admitting: Physical Therapy

## 2014-06-12 ENCOUNTER — Ambulatory Visit (HOSPITAL_COMMUNITY)
Admission: RE | Admit: 2014-06-12 | Discharge: 2014-06-12 | Disposition: A | Discharge status: Home / Self Care | Payer: 59 | Source: Ambulatory Visit | Attending: Internal Medicine | Admitting: Internal Medicine

## 2014-06-12 DIAGNOSIS — IMO0001 Reserved for inherently not codable concepts without codable children: Secondary | ICD-10-CM | POA: Diagnosis not present

## 2014-06-12 NOTE — Progress Notes (Signed)
Physical Therapy Treatment Patient Details  Name: Shannon Miles MRN: [id number] Date of Birth: [birth year]  Today's Date: 06/12/2014 Time: 4098-1191 PT Time Calculation (min): 49 min   Charges: gait training 1604-1620, TherEx 4782-9562 Visit#: 7 of 17  Re-eval: 07/10/14 Assessment Diagnosis: Sciatica, possible piriformis syndrome Next MD Visit: Ouida Sills:  Prior Therapy: no  Authorization: [employer]   Subjective: Symptoms/Limitations Symptoms: Patient states she conitues to do well  Exercise/Treatments Stretches Active Hamstring Stretch: Limitations Active Hamstring Stretch Limitations: 5x 3 seconds to 8" box 3D ITB Stretch: Limitations ITB Stretch Limitations: 10x 3" to 8" box Piriformis Stretch: Limitations Piriformis Stretch Limitations: 3way 10x 3' Gastroc Stretch: Limitations Gastroc Stretch Limitations: 5x 3"  Standing Forward Lunges: Limitations Forward Lunges Limitations: Lunge matrix common and uncommon 5x from 2" box Lateral Step Up: Limitations;Both;10 reps Lateral Step Up Limitations: 18" lateral, 12" 10x also transverse plane step up 10x Forward Step Up: Both;Limitations;10 reps Forward Step Up Limitations: 18" Functional Squat Limitations: 12" retro single leg toe touch squat reach matrix with 3lb dumbbell 5x Other Standing Knee Exercises: UE/LE side laying ground matrix 5x  Physical Therapy Assessment and Plan PT Assessment and Plan Clinical Impression Statement: Patient displays good performance of speed warm. Patient demosntrated running technique this sessiong demonstrated decreased stride length, but no pain, when asked to run faster patient continued to demsontrate minimal gait abnormalities. Patient demsontrated improved performance with lunges thise session despite increase in intensity and and good peroftrmance of step ups despite increase in depth.  PT Plan: possible discarge next session, Focus of next session on education in performance of HEP and  progression of exercises. Next session progress single leg balance reach matrix to airex pad.     Goals PT Long Term Goals PT Long Term Goal 1: Patient will display  improved glut med/max strengthe of 5/5 MMt indicatign improved hip strength and stability so patient can return to running.  PT Long Term Goal 1 - Progress: Progressing toward goal PT Long Term Goal 2: Patient will display increased hamstring strength of 5/5 manual muscle test so patient can return to running >35miles per workout PT Long Term Goal 2 - Progress: Progressing toward goal Long Term Goal 3: Patient will demosntrate equal frontal plane single leg balance reqaches withing 5cm indicating improved hip, kne and ankle stability on Lt vs Rt.  Long Term Goal 3 Progress: Progressing toward goal  Problem List Patient Active Problem List   Diagnosis Date Noted  . Sciatica 05/09/2014  . Piriformis syndrome of left side 05/09/2014  . Stiffness of joint of left pelvic region and thigh 05/09/2014  . Pain in joint, pelvic region and thigh 05/09/2014  . Contraceptive management 01/23/2014    PT - End of Session Activity Tolerance: Patient tolerated treatment well  GP    Auna Mikkelsen R 06/12/2014, 5:02 PM

## 2014-06-17 ENCOUNTER — Ambulatory Visit (HOSPITAL_COMMUNITY): Payer: 59 | Admitting: Physical Therapy

## 2014-06-19 ENCOUNTER — Ambulatory Visit (HOSPITAL_COMMUNITY): Payer: 59 | Admitting: Physical Therapy

## 2014-06-19 ENCOUNTER — Inpatient Hospital Stay (HOSPITAL_COMMUNITY): Admission: RE | Admit: 2014-06-19 | Payer: 59 | Source: Ambulatory Visit | Admitting: Physical Therapy

## 2014-06-19 NOTE — Evaluation (Signed)
Physical Therapy Evaluation  Patient Details  Name: Shannon Miles MRN: [id number] Date of Birth: June 21, 1966  Today's Date: 06/19/2014  Patient called and stated she would not be returning to therapy as she has met all her goals.   PT Short Term Goals PT Short Term Goal 1: Patient will display a negative thomas test indicating improved stride length durign walking and ruunning PT Short Term Goal 1 - Progress: Met PT Short Term Goal 2: Patient will display 20 degrees ankle dorsiflexion indicating decreased early heel off during gait. PT Short Term Goal 2 - Progress: Met PT Short Term Goal 3: Patient will display a negative obers test indicating increased tensor fascia latae and glut med/min mobility PT Short Term Goal 4: Patient will display a negative piriformis test indicating imptoved moscle mobility and decreased strain on sciatic nerve PT Short Term Goal 4 - Progress: Met PT Short Term Goal 5: Patient will diisplay a negative neural tension test for sciative nerve indicating improved nervsous tissue mobility so patient can walk and sit >1 hour without pain.  PT Short Term Goal 5 - Progress: Met PT Long Term Goals PT Long Term Goal 1: Patient will display  improved glut med/max strengthe of 5/5 MMt indicatign improved hip strength and stability so patient can return to running.  PT Long Term Goal 1 - Progress: Met PT Long Term Goal 2: Patient will display increased hamstring strength of 5/5 manual muscle test so patient can return to running >38mles per workout PT Long Term Goal 2 - Progress: Met Long Term Goal 3: Patient will demosntrate equal frontal plane single leg balance reqaches withing 5cm indicating improved hip, kne and ankle stability on Lt vs Rt.  Long Term Goal 3 Progress: Met  Clovis Warwick R 06/19/2014, 12:34 PM  Physician Documentation Your signature is required to indicate approval of the treatment plan as stated above.  Please sign and either send electronically or  make a copy of this report for your files and return this physician signed original.   Please mark one 1.__approve of plan  2. ___approve of plan with the following conditions.   ______________________________                                                          _____________________ Physician Signature                                                                                                             Date

## 2014-06-24 ENCOUNTER — Ambulatory Visit (HOSPITAL_COMMUNITY): Payer: 59 | Admitting: Physical Therapy

## 2014-06-26 ENCOUNTER — Ambulatory Visit (HOSPITAL_COMMUNITY): Payer: 59 | Admitting: Physical Therapy

## 2014-07-01 ENCOUNTER — Ambulatory Visit (HOSPITAL_COMMUNITY): Payer: 59 | Admitting: Physical Therapy

## 2014-07-03 ENCOUNTER — Ambulatory Visit (HOSPITAL_COMMUNITY): Payer: 59 | Admitting: Physical Therapy

## 2014-07-08 ENCOUNTER — Ambulatory Visit (HOSPITAL_COMMUNITY): Payer: 59 | Admitting: Physical Therapy

## 2014-07-10 ENCOUNTER — Ambulatory Visit (HOSPITAL_COMMUNITY): Payer: 59 | Admitting: Physical Therapy

## 2014-07-14 ENCOUNTER — Ambulatory Visit (HOSPITAL_COMMUNITY): Payer: 59 | Admitting: Physical Therapy

## 2014-07-15 ENCOUNTER — Ambulatory Visit (HOSPITAL_COMMUNITY): Payer: 59 | Admitting: Physical Therapy

## 2014-07-16 ENCOUNTER — Ambulatory Visit (HOSPITAL_COMMUNITY): Payer: 59 | Admitting: Physical Therapy

## 2014-07-17 ENCOUNTER — Ambulatory Visit (HOSPITAL_COMMUNITY): Payer: 59 | Admitting: Physical Therapy

## 2014-07-22 ENCOUNTER — Ambulatory Visit (HOSPITAL_COMMUNITY): Payer: 59 | Admitting: Physical Therapy

## 2014-07-24 ENCOUNTER — Ambulatory Visit (HOSPITAL_COMMUNITY): Payer: 59 | Admitting: Physical Therapy

## 2014-08-11 ENCOUNTER — Encounter: Payer: Self-pay | Admitting: Adult Health

## 2014-12-08 ENCOUNTER — Other Ambulatory Visit: Payer: Self-pay | Admitting: Adult Health

## 2014-12-08 DIAGNOSIS — Z1231 Encounter for screening mammogram for malignant neoplasm of breast: Secondary | ICD-10-CM

## 2015-01-19 ENCOUNTER — Inpatient Hospital Stay (HOSPITAL_COMMUNITY): Admission: RE | Admit: 2015-01-19 | Payer: Self-pay | Source: Ambulatory Visit

## 2015-01-20 ENCOUNTER — Ambulatory Visit (HOSPITAL_COMMUNITY): Payer: Self-pay

## 2015-01-21 ENCOUNTER — Ambulatory Visit
Admission: RE | Admit: 2015-01-21 | Discharge: 2015-01-21 | Disposition: A | Discharge status: Home / Self Care | Payer: 59 | Source: Ambulatory Visit | Attending: Adult Health | Admitting: Adult Health

## 2015-01-21 DIAGNOSIS — Z1231 Encounter for screening mammogram for malignant neoplasm of breast: Secondary | ICD-10-CM

## 2015-01-27 ENCOUNTER — Ambulatory Visit (INDEPENDENT_AMBULATORY_CARE_PROVIDER_SITE_OTHER): Payer: 59 | Admitting: Adult Health

## 2015-01-27 ENCOUNTER — Encounter: Payer: Self-pay | Admitting: Adult Health

## 2015-01-27 VITALS — BP 130/80 | HR 100 | Ht 63.25 in | Wt 136.0 lb

## 2015-01-27 DIAGNOSIS — Z3041 Encounter for surveillance of contraceptive pills: Secondary | ICD-10-CM

## 2015-01-27 DIAGNOSIS — Z1212 Encounter for screening for malignant neoplasm of rectum: Secondary | ICD-10-CM

## 2015-01-27 DIAGNOSIS — Z01419 Encounter for gynecological examination (general) (routine) without abnormal findings: Secondary | ICD-10-CM | POA: Diagnosis not present

## 2015-01-27 LAB — HEMOCCULT GUIAC POC 1CARD (OFFICE): FECAL OCCULT BLD: NEGATIVE

## 2015-01-27 MED ORDER — NORGESTREL-ETHINYL ESTRADIOL 0.5-50 MG-MCG PO TABS: ORAL_TABLET | ORAL | Status: DC

## 2015-01-27 NOTE — Progress Notes (Signed)
Patient ID: Shannon Miles, female   DOB: 02-Oct-1966, 49 y.o.   MRN: [id number] History of Present Illness:  Shannon Miles is a  49 year old white female, married in for well woman gyn exam.She had a normal pap with negative HPV 11/30/12.  Current Medications, Allergies, Past Medical History, Past Surgical History, Family History and Social History were reviewed in [facility] electronic medical record.     Review of Systems: Patient denies any headaches, hearing loss, fatigue, blurred vision, shortness of breath, chest pain, abdominal pain, problems with bowel movements, urination, or intercourse. No joint pain or mood swings.Periods are 1 day now, happy with OCs.    Physical Exam:BP 130/80 mmHg  Pulse 100  Ht 5' 3.25" (1.607 m)  Wt 136 lb (61.689 kg)  BMI 23.89 kg/m2  LMP 12/29/2014 General:  Well developed, well nourished, no acute distress Skin:  Warm and dry Neck:  Midline trachea, normal thyroid, good ROM, no lymphadenopathy Lungs; Clear to auscultation bilaterally Breast:  No dominant palpable mass, retraction, or nipple discharge Cardiovascular: Regular rate and rhythm Abdomen:  Soft, non tender, no hepatosplenomegaly Pelvic:  External genitalia is normal in appearance, no lesions.  The vagina is normal in appearance. Urethra has no lesions or masses. The cervix is smooth.  Uterus is felt to be normal size, shape, and contour.  No adnexal masses or tenderness noted.Bladder is non tender, no masses felt. Rectal: Good sphincter tone, no polyps, or hemorrhoids felt.  Hemoccult negative. Extremities/musculoskeletal:  No swelling or varicosities noted, no clubbing or cyanosis Psych:  No mood changes, alert and cooperative,seems happy She exercises regularly and is still working full time.  Impression: Well woman gyn exam no pap Contraceptive management    Plan: Pap and physical in 1 year Mammogram yearly Labs with Dr Ouida SillsFagan Colonoscopy at 6150

## 2015-01-27 NOTE — Patient Instructions (Signed)
Pap and physical in  1 year Mammogram yearly Labs with PCP Colonoscopy at 51 

## 2015-01-28 ENCOUNTER — Ambulatory Visit (HOSPITAL_COMMUNITY): Payer: Self-pay

## 2015-07-08 ENCOUNTER — Telehealth: Payer: Self-pay | Admitting: Adult Health

## 2015-07-08 MED ORDER — FLUCONAZOLE 150 MG PO TABS: ORAL_TABLET | ORAL | Status: DC

## 2015-07-08 NOTE — Telephone Encounter (Signed)
Spoke with pt. She has been on an antibiotic recently. She has a yeast infection. She had 1 Diflucan at home but needs more. Can you order Diflucan? Thanks!! JSY

## 2015-07-08 NOTE — Telephone Encounter (Signed)
Will rx diflucan  

## 2015-07-08 NOTE — Telephone Encounter (Signed)
Left message x 1. JSY 

## 2015-12-15 ENCOUNTER — Other Ambulatory Visit: Payer: Self-pay

## 2015-12-15 DIAGNOSIS — Z1231 Encounter for screening mammogram for malignant neoplasm of breast: Secondary | ICD-10-CM

## 2016-01-22 ENCOUNTER — Ambulatory Visit
Admission: RE | Admit: 2016-01-22 | Discharge: 2016-01-22 | Disposition: A | Discharge status: Home / Self Care | Payer: 59 | Source: Ambulatory Visit

## 2016-01-22 DIAGNOSIS — Z1231 Encounter for screening mammogram for malignant neoplasm of breast: Secondary | ICD-10-CM

## 2016-01-29 ENCOUNTER — Ambulatory Visit (INDEPENDENT_AMBULATORY_CARE_PROVIDER_SITE_OTHER): Payer: 59 | Admitting: Adult Health

## 2016-01-29 ENCOUNTER — Encounter: Payer: Self-pay | Admitting: Adult Health

## 2016-01-29 ENCOUNTER — Other Ambulatory Visit (HOSPITAL_COMMUNITY)
Admission: RE | Admit: 2016-01-29 | Discharge: 2016-01-29 | Disposition: A | Discharge status: Home / Self Care | Payer: 59 | Source: Ambulatory Visit | Attending: Adult Health | Admitting: Adult Health

## 2016-01-29 VITALS — BP 140/80 | HR 102 | Ht 63.5 in | Wt 139.0 lb

## 2016-01-29 DIAGNOSIS — Z1211 Encounter for screening for malignant neoplasm of colon: Secondary | ICD-10-CM | POA: Diagnosis not present

## 2016-01-29 DIAGNOSIS — Z1151 Encounter for screening for human papillomavirus (HPV): Secondary | ICD-10-CM | POA: Diagnosis not present

## 2016-01-29 DIAGNOSIS — Z01419 Encounter for gynecological examination (general) (routine) without abnormal findings: Secondary | ICD-10-CM | POA: Diagnosis not present

## 2016-01-29 DIAGNOSIS — Z3041 Encounter for surveillance of contraceptive pills: Secondary | ICD-10-CM

## 2016-01-29 LAB — HEMOCCULT GUIAC POC 1CARD (OFFICE): FECAL OCCULT BLD: NEGATIVE

## 2016-01-29 MED ORDER — NORGESTREL-ETHINYL ESTRADIOL 0.5-50 MG-MCG PO TABS: ORAL_TABLET | ORAL | Status: DC

## 2016-01-29 NOTE — Patient Instructions (Signed)
Physical in 1 year, pap in 3 if normal Mammogram yearly Colonoscopy at 51 Labs with PCP 

## 2016-01-29 NOTE — Progress Notes (Signed)
Patient ID: Shannon Miles, female   DOB: 05/05/1966, 50 y.o.   MRN: [id number] History of Present Illness: Shannon Miles is a 50 year old white female, married in for a well woman gyn exam and pap.She needs a refill on OCs, and is happy with them.Has occasional hot flash.Has had recent sinus infection and was treated by Dr Ouida SillsFagan, with 2 rounds of antibbiotics. PCP is Dr Ouida SillsFagan.   Current Medications, Allergies, Past Medical History, Past Surgical History, Family History and Social History were reviewed in [facility] electronic medical record.     Review of Systems: Patient denies any headaches, hearing loss, fatigue, blurred vision, shortness of breath, chest pain, abdominal pain, problems with bowel movements, urination, or intercourse. No joint pain or mood swings.+occasional hot flash    Physical Exam:BP 140/80 mmHg  Pulse 102  Ht 5' 3.5" (1.613 m)  Wt 139 lb (63.05 kg)  BMI 24.23 kg/m2  LMP 01/29/2016 General:  Well developed, well nourished, no acute distress Skin:  Warm and dry Neck:  Midline trachea, normal thyroid, good ROM, no lymphadenopathy Lungs; Clear to auscultation bilaterally Breast:  No dominant palpable mass, retraction, or nipple discharge Cardiovascular: Regular rate and rhythm Abdomen:  Soft, non tender, no hepatosplenomegaly Pelvic:  External genitalia is normal in appearance, no lesions.  The vagina is normal in appearance, with brown period blood, just started today. Urethra has no lesions or masses. The cervix is bulbous, Pap with HPV performed.  Uterus is felt to be normal size, shape, and contour.  No adnexal masses or tenderness noted.Bladder is non tender, no masses felt. Rectal: Good sphincter tone, no polyps, or hemorrhoids felt.  Hemoccult negative. Extremities/musculoskeletal:  No swelling or varicosities noted, no clubbing or cyanosis Psych:  No mood changes, alert and cooperative,seems happy   Impression: Well woman gyn exam and pap Contraceptive  management    Plan: Refilled Ogestrel 0.5-50 mg-mcg Disp 1 pack take 1 daily with prn refill Physical in 1 year, pap in 3 if normal Mammogram yearly, just had and was negative Colonoscopy at 51  Labs with Dr Ouida SillsFagan

## 2016-02-01 LAB — CYTOLOGY - PAP

## 2016-08-25 ENCOUNTER — Telehealth: Payer: Self-pay | Admitting: *Deleted

## 2016-08-25 NOTE — Telephone Encounter (Signed)
Can have labs and lay down

## 2016-12-13 ENCOUNTER — Other Ambulatory Visit: Payer: Self-pay | Admitting: Internal Medicine

## 2016-12-13 DIAGNOSIS — Z1231 Encounter for screening mammogram for malignant neoplasm of breast: Secondary | ICD-10-CM

## 2016-12-23 DIAGNOSIS — E785 Hyperlipidemia, unspecified: Secondary | ICD-10-CM | POA: Diagnosis not present

## 2016-12-23 DIAGNOSIS — E559 Vitamin D deficiency, unspecified: Secondary | ICD-10-CM | POA: Diagnosis not present

## 2016-12-23 DIAGNOSIS — E119 Type 2 diabetes mellitus without complications: Secondary | ICD-10-CM | POA: Diagnosis not present

## 2016-12-23 DIAGNOSIS — Z79899 Other long term (current) drug therapy: Secondary | ICD-10-CM | POA: Diagnosis not present

## 2016-12-30 DIAGNOSIS — R030 Elevated blood-pressure reading, without diagnosis of hypertension: Secondary | ICD-10-CM | POA: Diagnosis not present

## 2016-12-30 DIAGNOSIS — E119 Type 2 diabetes mellitus without complications: Secondary | ICD-10-CM | POA: Diagnosis not present

## 2016-12-30 DIAGNOSIS — E785 Hyperlipidemia, unspecified: Secondary | ICD-10-CM | POA: Diagnosis not present

## 2017-01-23 ENCOUNTER — Ambulatory Visit
Admission: RE | Admit: 2017-01-23 | Discharge: 2017-01-23 | Disposition: A | Discharge status: Home / Self Care | Payer: 59 | Source: Ambulatory Visit | Attending: Internal Medicine | Admitting: Internal Medicine

## 2017-01-23 DIAGNOSIS — Z1231 Encounter for screening mammogram for malignant neoplasm of breast: Secondary | ICD-10-CM

## 2017-02-01 ENCOUNTER — Encounter: Payer: Self-pay | Admitting: Adult Health

## 2017-02-01 ENCOUNTER — Ambulatory Visit (INDEPENDENT_AMBULATORY_CARE_PROVIDER_SITE_OTHER): Payer: 59 | Admitting: Adult Health

## 2017-02-01 VITALS — BP 110/66 | HR 87 | Ht 63.5 in | Wt 136.5 lb

## 2017-02-01 DIAGNOSIS — Z01419 Encounter for gynecological examination (general) (routine) without abnormal findings: Secondary | ICD-10-CM | POA: Diagnosis not present

## 2017-02-01 DIAGNOSIS — Z1212 Encounter for screening for malignant neoplasm of rectum: Secondary | ICD-10-CM | POA: Diagnosis not present

## 2017-02-01 DIAGNOSIS — Z3041 Encounter for surveillance of contraceptive pills: Secondary | ICD-10-CM

## 2017-02-01 DIAGNOSIS — Z1211 Encounter for screening for malignant neoplasm of colon: Secondary | ICD-10-CM | POA: Diagnosis not present

## 2017-02-01 LAB — HEMOCCULT GUIAC POC 1CARD (OFFICE): Fecal Occult Blood, POC: NEGATIVE

## 2017-02-01 MED ORDER — NORGESTREL-ETHINYL ESTRADIOL 0.5-50 MG-MCG PO TABS: ORAL_TABLET | ORAL | 99 refills | Status: DC

## 2017-02-01 NOTE — Progress Notes (Signed)
Patient ID: Shannon Miles, female   DOB: 1966-05-14, 51 y.o.   MRN: [id number] History of Present Illness: Shannon Miles is a 51 year old white female, married in for a well woman gyn exam, she had a normal pap with negative HPV 01/29/16. PCP is Dr Ouida Sills.   Current Medications, Allergies, Past Medical History, Past Surgical History, Family History and Social History were reviewed in [facility] record.     Review of Systems: Patient denies any headaches, hearing loss, fatigue, blurred vision, shortness of breath, chest pain, abdominal pain, problems with bowel movements, urination, or intercourse. No joint pain or mood swings.    Physical Exam:BP 110/66 (BP Location: Left Arm, Patient Position: Sitting, Cuff Size: Normal)   Pulse 87   Ht 5' 3.5" (1.613 m)   Wt 136 lb 8 oz (61.9 kg)   LMP 01/26/2017 (Approximate)   BMI 23.80 kg/m  General:  Well developed, well nourished, no acute distress Skin:  Warm and dry Neck:  Midline trachea, normal thyroid, good ROM, no lymphadenopathy Lungs; Clear to auscultation bilaterally Breast:  No dominant palpable mass, retraction, or nipple discharge Cardiovascular: Regular rate and rhythm Abdomen:  Soft, non tender, no hepatosplenomegaly Pelvic:  External genitalia is normal in appearance, no lesions.  The vagina is normal in appearance. Urethra has no lesions or masses. The cervix is smooth.  Uterus is felt to be normal size, shape, and contour.  No adnexal masses or tenderness noted.Bladder is non tender, no masses felt. Rectal: Good sphincter tone, no polyps, or hemorrhoids felt.  Hemoccult negative. Extremities/musculoskeletal:  No swelling or varicosities noted, no clubbing or cyanosis Psych:  No mood changes, alert and cooperative,seems happy PHQ 2 score 0.  Impression:  1. Well woman exam with routine gynecological exam   2. Encounter for surveillance of contraceptive pills   3. Screening for colorectal cancer       Plan:  Refilled Ogestrel prn Physical in 1 year Pap in 2020 Mammogram yearly Labs with PCP Colonoscopy advised

## 2017-02-17 ENCOUNTER — Telehealth: Payer: Self-pay | Admitting: Gastroenterology

## 2017-02-17 NOTE — Telephone Encounter (Signed)
Pt said that SF told her to call the office to schedule her first colonoscopy. Please call [phone]

## 2017-02-23 ENCOUNTER — Telehealth: Payer: Self-pay

## 2017-02-23 NOTE — Telephone Encounter (Signed)
See separate triage.  

## 2017-02-23 NOTE — Telephone Encounter (Signed)
Patient would like the MoviPrep

## 2017-03-07 NOTE — Telephone Encounter (Signed)
Noted  

## 2017-03-08 NOTE — Telephone Encounter (Addendum)
Gastroenterology Pre-Procedure Review  Request Date: 02/23/2017 Requesting Physician:   PATIENT REVIEW QUESTIONS: The patient responded to the following health history questions as indicated:    PT SAID SHE DOES NOT WANT ANY ANESTHESIA AND THAT DR. FIELDS IS AWARE SHE ALSO REQUESTS THE MOVIE PREP  1. Diabetes Melitis: YES 2. Joint replacements in the past 12 months: no 3. Major health problems in the past 3 months: no 4. Has an artificial valve or MVP: no 5. Has a defibrillator: no 6. Has been advised in past to take antibiotics in advance of a procedure like teeth cleaning: no 7. Family history of colon cancer: no  8. Alcohol Use: no 9. History of sleep apnea: no  10. History of coronary artery or other vascular stents placed within the last 12 months: no    MEDICATIONS & ALLERGIES:    Patient reports the following regarding taking any blood thinners:   Plavix? no Aspirin? YES Coumadin? no Brilinta? no Xarelto? no Eliquis? no Pradaxa? no Savaysa? no Effient? no  Patient confirms/reports the following medications:  Current Outpatient Prescriptions  Medication Sig Dispense Refill  . aspirin EC 81 MG tablet Take 81 mg by mouth daily.    Marland Kitchen. atorvastatin (LIPITOR) 10 MG tablet Take 10 mg by mouth daily.    Marland Kitchen. b complex vitamins capsule Take 1 capsule by mouth daily.    Marland Kitchen. BIOTIN PO Take by mouth daily.    . Cholecalciferol (VITAMIN D3) 3000 UNITS TABS Take by mouth daily.    . cimetidine (TAGAMET) 400 MG tablet daily.     Marland Kitchen. levocetirizine (XYZAL) 5 MG tablet 5 mg daily.     . metFORMIN (GLUCOPHAGE) 1000 MG tablet Take 500 mg by mouth 2 (two) times daily with a meal.     . norgestrel-ethinyl estradiol (OGESTREL) 0.5-50 MG-MCG tablet TAKE ONE TABLET BY MOUTH DAILY. 28 tablet PRN   No current facility-administered medications for this visit.     Patient confirms/reports the following allergies:  Allergies  Allergen Reactions  . Penicillins Rash    No orders of the defined  types were placed in this encounter.   AUTHORIZATION INFORMATION Primary Insurance:   ID #:   Group #:  Pre-Cert / Auth required:  Pre-Cert / Auth #:   Secondary Insurance:   ID #:   Group #:  Pre-Cert / Auth required:  Pre-Cert / Auth #:   SCHEDULE INFORMATION: Procedure has been scheduled as follows:  Date: 05/01/2017             Time:9:30 AM   Location: [facility] Short Stay  This Gastroenterology Pre-Precedure Review Form is being routed to the following provider(s): Jonette EvaSandi Fields, MD

## 2017-03-09 NOTE — Telephone Encounter (Signed)
MOVI PREP SPLIT DOSING, CLEAR LIQUIDS WITH BREAKFAST. CONTINUE GLUCOPHAGE.

## 2017-03-10 MED ORDER — PEG-KCL-NACL-NASULF-NA ASC-C 100 G PO SOLR: 1.0000 | ORAL | 0 refills | Status: DC

## 2017-03-10 NOTE — Telephone Encounter (Signed)
Dr. Darrick PennaFields, This pt does not any anesthesia. She said you was aware of her choice. Does this mean she will not get conscious sedation?  Do I need to put something special in the comments?

## 2017-03-10 NOTE — Addendum Note (Signed)
Addended by: Lavena BullionSTEWART, Odilia Damico H on: 03/10/2017 09:40 AM   Modules accepted: Orders

## 2017-03-10 NOTE — Telephone Encounter (Signed)
Rx sent to the pharmacy and instructions mailed to pt.  

## 2017-03-13 NOTE — Telephone Encounter (Signed)
REVIEWED. No anesthesia per pt request.

## 2017-03-14 ENCOUNTER — Other Ambulatory Visit: Payer: Self-pay

## 2017-03-14 DIAGNOSIS — Z1211 Encounter for screening for malignant neoplasm of colon: Secondary | ICD-10-CM

## 2017-03-14 NOTE — Telephone Encounter (Signed)
Noted on the special needs of the order.

## 2017-03-23 NOTE — Telephone Encounter (Signed)
PA for TCS is [id number]

## 2017-04-26 DIAGNOSIS — E119 Type 2 diabetes mellitus without complications: Secondary | ICD-10-CM | POA: Diagnosis not present

## 2017-05-01 ENCOUNTER — Encounter (HOSPITAL_COMMUNITY): Payer: Self-pay | Admitting: *Deleted

## 2017-05-01 ENCOUNTER — Encounter (HOSPITAL_COMMUNITY)
Admission: RE | Disposition: A | Discharge status: Home / Self Care | Payer: Self-pay | Source: Ambulatory Visit | Attending: Gastroenterology

## 2017-05-01 ENCOUNTER — Ambulatory Visit (HOSPITAL_COMMUNITY)
Admission: RE | Admit: 2017-05-01 | Discharge: 2017-05-01 | Disposition: A | Discharge status: Home / Self Care | Payer: 59 | Source: Ambulatory Visit | Attending: Gastroenterology | Admitting: Gastroenterology

## 2017-05-01 DIAGNOSIS — E119 Type 2 diabetes mellitus without complications: Secondary | ICD-10-CM | POA: Diagnosis not present

## 2017-05-01 DIAGNOSIS — Q438 Other specified congenital malformations of intestine: Secondary | ICD-10-CM | POA: Diagnosis not present

## 2017-05-01 DIAGNOSIS — K648 Other hemorrhoids: Secondary | ICD-10-CM | POA: Insufficient documentation

## 2017-05-01 DIAGNOSIS — Z1211 Encounter for screening for malignant neoplasm of colon: Secondary | ICD-10-CM | POA: Diagnosis not present

## 2017-05-01 DIAGNOSIS — Z7984 Long term (current) use of oral hypoglycemic drugs: Secondary | ICD-10-CM | POA: Diagnosis not present

## 2017-05-01 DIAGNOSIS — Z1212 Encounter for screening for malignant neoplasm of rectum: Secondary | ICD-10-CM | POA: Diagnosis not present

## 2017-05-01 DIAGNOSIS — Z833 Family history of diabetes mellitus: Secondary | ICD-10-CM | POA: Insufficient documentation

## 2017-05-01 DIAGNOSIS — Z79899 Other long term (current) drug therapy: Secondary | ICD-10-CM | POA: Insufficient documentation

## 2017-05-01 DIAGNOSIS — Z880 Allergy status to penicillin: Secondary | ICD-10-CM | POA: Diagnosis not present

## 2017-05-01 DIAGNOSIS — Z7982 Long term (current) use of aspirin: Secondary | ICD-10-CM | POA: Insufficient documentation

## 2017-05-01 DIAGNOSIS — Z8249 Family history of ischemic heart disease and other diseases of the circulatory system: Secondary | ICD-10-CM | POA: Diagnosis not present

## 2017-05-01 HISTORY — PX: COLONOSCOPY: SHX5424

## 2017-05-01 LAB — GLUCOSE, CAPILLARY: Glucose-Capillary: 91 mg/dL (ref 65–99)

## 2017-05-01 SURGERY — COLONOSCOPY
Anesthesia: Moderate Sedation

## 2017-05-01 MED ORDER — STERILE WATER FOR IRRIGATION IR SOLN
Status: DC | PRN
  Administered 2017-05-01: 10:00:00

## 2017-05-01 NOTE — Op Note (Signed)
[facility] Patient Name: Shannon Miles Procedure Date: 05/01/2017 9:22 AM MRN: [id number] Date of Birth: 1966/01/23 Attending MD: Jonette Eva , MD CSN: [id number] Age: 51 Admit Type: Outpatient Procedure:                Colonoscopy, SCREENING Indications:              Screening for colorectal malignant neoplasm Providers:                Jonette Eva, MD, Loma Messing B. Patsy Lager, RN, Dyann Ruddle Referring MD:             Carylon Perches Medicines:                None Complications:            No immediate complications. Estimated Blood Loss:     Estimated blood loss: none. Procedure:                Pre-Anesthesia Assessment:                           - Prior to the procedure, a History and Physical                            was performed, and patient medications and                            allergies were reviewed. The patient's tolerance of                            previous anesthesia was also reviewed. The risks                            and benefits of the procedure and the sedation                            options and risks were discussed with the patient.                            All questions were answered, and informed consent                            was obtained. Prior Anticoagulants: The patient has                            taken aspirin, last dose was 1 day prior to                            procedure. ASA Grade Assessment: II - A patient                            with mild systemic disease. After reviewing the  risks and benefits, the patient was deemed in                            satisfactory condition to undergo the procedure.                            After obtaining informed consent, the colonoscope                            was passed under direct vision. Throughout the                            procedure, the patient's blood pressure, pulse, and                            oxygen saturations were  monitored continuously. The                            EC-[id number] ([id number]) scope was introduced through                            the anus and advanced to the the cecum, identified                            by appendiceal orifice and ileocecal valve. The                            colonoscopy was somewhat difficult due to                            significant looping. Successful completion of the                            procedure was aided by COLOWRAP. The patient                            tolerated the procedure fairly well. The ileocecal                            valve, appendiceal orifice, and rectum were                            photographed. The quality of the bowel preparation                            was excellent. Scope In: 9:54:59 AM Scope Out: 10:14:03 AM Scope Withdrawal Time: 0 hours 13 minutes 9 seconds  Total Procedure Duration: 0 hours 19 minutes 4 seconds  Findings:      The recto-sigmoid colon, sigmoid colon and descending colon were       moderately redundant.      The exam was otherwise normal throughout the examined colon.      Internal hemorrhoids were found during retroflexion. The hemorrhoids       were moderate. Impression:               -  Redundant colon.                           - Internal hemorrhoids. Moderate Sedation:      Moderate (conscious) sedation was administered by the endoscopy nurse       and supervised by the endoscopist. The following parameters were       monitored: oxygen saturation, heart rate, blood pressure, and response       to care. Recommendation:           - Repeat colonoscopy in 10 years for surveillance.                           - High fiber diet.                           - Continue present medications.                           - Patient has a contact number available for                            emergencies. The signs and symptoms of potential                            delayed complications were discussed with  the                            patient. Return to normal activities tomorrow.                            Written discharge instructions were provided to the                            patient. Procedure Code(s):        --- Professional ---                           [phone] [id number], Colonoscopy, flexible; diagnostic, including                            collection of specimen(s) by brushing or washing,                            when performed (separate procedure) Diagnosis Code(s):        --- Professional ---                           Z12.11, Encounter for screening for malignant                            neoplasm of colon                           K64.8, Other hemorrhoids                           Q43.8, Other  specified congenital malformations of                            intestine CPT copyright 2016 American Medical Association. All rights reserved. The codes documented in this report are preliminary and upon coder review may  be revised to meet current compliance requirements. Jonette Eva, MD Jonette Eva, MD 05/01/2017 10:33:00 AM This report has been signed electronically. Number of Addenda: 0

## 2017-05-01 NOTE — H&P (Signed)
Primary Care Physician:  Carylon PerchesFagan, Roy, MD Primary Gastroenterologist:  Dr. Darrick PennaFields  Pre-Procedure History & Physical: HPI:  Shannon Miles is a 51 y.o. female here for COLON CANCER SCREENING.  Past Medical History:  Diagnosis Date  . Contraceptive management 01/23/2014  . Diabetes mellitus without complication [facility](HCC)     Past Surgical History:  Procedure Laterality Date  . FOOT SURGERY     No needles  . NO PAST SURGERIES      Prior to Admission medications   Medication Sig Start Date End Date Taking? Authorizing Provider  aspirin EC 81 MG tablet Take 81 mg by mouth daily.   Yes [provider]  atorvastatin (LIPITOR) 10 MG tablet Take 10 mg by mouth daily.   Yes [provider]  b complex vitamins capsule Take 1 capsule by mouth daily.   Yes [provider]  BIOTIN PO Take 1 tablet by mouth daily.    Yes [provider]  cholecalciferol (VITAMIN D) 1000 units tablet Take 1 tablet by mouth daily.    Yes [provider]  cimetidine (TAGAMET) 400 MG tablet Take 400 mg by mouth daily.  12/20/13  Yes [provider]  CINNAMON PO Take 2,000 mg by mouth daily. 2  Capsules daily (1000 mg per capsule)   Yes [provider]  levocetirizine (XYZAL) 5 MG tablet Take 5 mg by mouth every evening.  01/18/14  Yes [provider]  metFORMIN (GLUCOPHAGE) 1000 MG tablet Take 1,000 mg by mouth 2 (two) times daily with a meal.  01/30/17  Yes [provider]  Multiple Vitamins-Minerals (MULTIVITAMIN WITH MINERALS) tablet Take 1 tablet by mouth daily.   Yes [provider]  norgestrel-ethinyl estradiol (OGESTREL) 0.5-50 MG-MCG tablet TAKE ONE TABLET BY MOUTH DAILY. 02/01/17  Yes Adline PotterGriffin, Jennifer A, NP    Allergies as of 03/14/2017 - Review Complete 02/23/2017  Allergen Reaction Noted  . Penicillins Rash 01/23/2014    Family History  Problem Relation Age of Onset  . Diabetes Mother   . Diabetes Father   .  Hypertension Father   . Hypertension Brother     Social History   Social History  . Marital status: Married    Spouse name: N/A  . Number of children: N/A  . Years of education: N/A   Occupational History  . Not on file.   Social History Main Topics  . Smoking status: Never Smoker  . Smokeless tobacco: Never Used  . Alcohol use No  . Drug use: No  . Sexual activity: Yes    Birth control/ protection: Pill   Other Topics Concern  . Not on file   Social History Narrative  . No narrative on file    Review of Systems: See HPI, otherwise negative ROS   Physical Exam: BP (!) 145/74   Pulse (!) 101   Temp 98 F (36.7 C) (Oral)   Resp (!) 27   LMP 04/27/2017   SpO2 99%  General:   Alert,  pleasant and cooperative in NAD Head:  Normocephalic and atraumatic. Neck:  Supple; Lungs:  Clear throughout to auscultation.    Heart:  Regular rate and rhythm. Abdomen:  Soft, nontender and nondistended. Normal bowel sounds, without guarding, and without rebound.   Neurologic:  Alert and  oriented x4;  grossly normal neurologically.  Impression/Plan:     SCREENING  Plan:  1. TCS TODAY. DISCUSSED PROCEDURE, BENEFITS, & RISKS: < 1% chance of medication reaction, bleeding, perforation, or rupture of  spleen/liver.

## 2017-05-01 NOTE — Discharge Instructions (Signed)
You have moderate size internal hemorrhoids. YOU DID NOT HAVE ANY POLYPS. ° ° °DRINK WATER TO KEEP YOUR URINE LIGHT YELLOW. ° °FOLLOW A HIGH FIBER DIET. AVOID ITEMS THAT CAUSE BLOATING. SEE INFO BELOW. ° °USE PREPARATION H FOUR TIMES  A DAY IF NEEDED TO RELIEVE RECTAL PAIN/PRESSURE/BLEEDING. ° °Next colonoscopy in 10 years. ° °Colonoscopy °Care After °Read the instructions outlined below and refer to this sheet in the next week. These discharge instructions provide you with general information on caring for yourself after you leave the hospital. While your treatment has been planned according to the most current medical practices available, unavoidable complications occasionally occur. If you have any problems or questions after discharge, call DR. Keandrea Tapley, [phone]. ° °ACTIVITY °· You may resume your regular activity, but move at a slower pace for the next 24 hours.  °· Take frequent rest periods for the next 24 hours.  °· Walking will help get rid of the air and reduce the bloated feeling in your belly (abdomen).  °· No driving for 24 hours (because of the medicine (anesthesia) used during the test).  °· You may shower.  °· Do not sign any important legal documents or operate any machinery for 24 hours (because of the anesthesia used during the test).  °·  °NUTRITION °· Drink plenty of fluids.  °· You may resume your normal diet as instructed by your doctor.  °· Begin with a light meal and progress to your normal diet. Heavy or fried foods are harder to digest and may make you feel sick to your stomach (nauseated).  °· Avoid alcoholic beverages for 24 hours or as instructed.  °·  °MEDICATIONS °· You may resume your normal medications. °·  °WHAT YOU CAN EXPECT TODAY °· Some feelings of bloating in the abdomen.  °· Passage of more gas than usual.  °· Spotting of blood in your stool or on the toilet paper °· .  °IF YOU HAD POLYPS REMOVED DURING THE COLONOSCOPY: °· Eat a soft diet IF YOU HAVE NAUSEA, BLOATING,  ABDOMINAL PAIN, OR VOMITING. °·   °FINDING OUT THE RESULTS OF YOUR TEST °Not all test results are available during your visit. DR. Nole Robey WILL CALL YOU WITHIN 14 DAYS OF YOUR PROCEDUE WITH YOUR RESULTS. Do not assume everything is normal if you have not heard from DR. Zaliah Wissner, CALL HER OFFICE AT [phone]. ° °SEEK IMMEDIATE MEDICAL ATTENTION AND CALL THE OFFICE: [phone] IF: °· You have more than a spotting of blood in your stool.  °· Your belly is swollen (abdominal distention).  °· You are nauseated or vomiting.  °· You have a temperature over 101F.  °· You have abdominal pain or discomfort that is severe or gets worse throughout the day. ° °High-Fiber Diet °A high-fiber diet changes your normal diet to include more whole grains, legumes, fruits, and vegetables. Changes in the diet involve replacing refined carbohydrates with unrefined foods. The calorie level of the diet is essentially unchanged. The Dietary Reference Intake (recommended amount) for adult males is 38 grams per day. For adult females, it is 25 grams per day. Pregnant and lactating women should consume 28 grams of fiber per day. °Fiber is the intact part of a plant that is not broken down during digestion. Functional fiber is fiber that has been isolated from the plant to provide a beneficial effect in the body. °PURPOSE °· Increase stool bulk.  °· Ease and regulate bowel movements.  °· Lower cholesterol.  °· REDUCE RISK OF COLON   CANCER ° °INDICATIONS THAT YOU NEED MORE FIBER °· Constipation and hemorrhoids.  °· Uncomplicated diverticulosis (intestine condition) and irritable bowel syndrome.  °· Weight management.  °· As a protective measure against hardening of the arteries (atherosclerosis), diabetes, and cancer.  ° °GUIDELINES FOR INCREASING FIBER IN THE DIET °· Start adding fiber to the diet slowly. A gradual increase of about 5 more grams (2 slices of whole-wheat bread, 2 servings of most fruits or vegetables, or 1 bowl of high-fiber  cereal) per day is best. Too rapid an increase in fiber may result in constipation, flatulence, and bloating.  °· Drink enough water and fluids to keep your urine clear or pale yellow. Water, juice, or caffeine-free drinks are recommended. Not drinking enough fluid may cause constipation.  °· Eat a variety of high-fiber foods rather than one type of fiber.  °· Try to increase your intake of fiber through using high-fiber foods rather than fiber pills or supplements that contain small amounts of fiber.  °· The goal is to change the types of food eaten. Do not supplement your present diet with high-fiber foods, but replace foods in your present diet.   ° °INCLUDE A VARIETY OF FIBER SOURCES °· Replace refined and processed grains with whole grains, canned fruits with fresh fruits, and incorporate other fiber sources. White rice, white breads, and most bakery goods contain little or no fiber.  °· Brown whole-grain rice, buckwheat oats, and many fruits and vegetables are all good sources of fiber. These include: broccoli, Brussels sprouts, cabbage, cauliflower, beets, sweet potatoes, white potatoes (skin on), carrots, tomatoes, eggplant, squash, berries, fresh fruits, and dried fruits.  °· Cereals appear to be the richest source of fiber. Cereal fiber is found in whole grains and bran. Bran is the fiber-rich outer coat of cereal grain, which is largely removed in refining. In whole-grain cereals, the bran remains. In breakfast cereals, the largest amount of fiber is found in those with "bran" in their names. The fiber content is sometimes indicated on the label.  °· You may need to include additional fruits and vegetables each day.  °· In baking, for 1 cup white flour, you may use the following substitutions:  °· 1 cup whole-wheat flour minus 2 tablespoons.  °· 1/2 cup white flour plus 1/2 cup whole-wheat flour.  ° °Hemorrhoids °Hemorrhoids are dilated (enlarged) veins around the rectum. Sometimes clots will form in the  veins. This makes them swollen and painful. These are called thrombosed hemorrhoids. °Causes of hemorrhoids include: °· Constipation.  °· Straining to have a bowel movement. °·  HEAVY LIFTING ° °HOME CARE INSTRUCTIONS °· Eat a well balanced diet and drink 6 to 8 glasses of water every day to avoid constipation. You may also use a bulk laxative.  °· Avoid straining to have bowel movements.  °· Keep anal area dry and clean.  °· Do not use a donut shaped pillow or sit on the toilet for long periods. This increases blood pooling and pain.  °· Move your bowels when your body has the urge; this will require less straining and will decrease pain and pressure.  ° °

## 2017-05-04 ENCOUNTER — Encounter (HOSPITAL_COMMUNITY): Payer: Self-pay | Admitting: Gastroenterology

## 2017-08-28 DIAGNOSIS — E119 Type 2 diabetes mellitus without complications: Secondary | ICD-10-CM | POA: Diagnosis not present

## 2017-12-15 ENCOUNTER — Telehealth: Payer: Self-pay | Admitting: Adult Health

## 2017-12-15 ENCOUNTER — Other Ambulatory Visit: Payer: Self-pay | Admitting: Internal Medicine

## 2017-12-15 DIAGNOSIS — Z1231 Encounter for screening mammogram for malignant neoplasm of breast: Secondary | ICD-10-CM

## 2017-12-15 MED ORDER — FLUCONAZOLE 150 MG PO TABS: ORAL_TABLET | ORAL | 1 refills | Status: DC

## 2017-12-15 NOTE — Telephone Encounter (Signed)
Pt complains of yeast infection after taking antibiotics for 2 weeks, will rx diflucan

## 2017-12-27 DIAGNOSIS — E785 Hyperlipidemia, unspecified: Secondary | ICD-10-CM | POA: Diagnosis not present

## 2017-12-27 DIAGNOSIS — E119 Type 2 diabetes mellitus without complications: Secondary | ICD-10-CM | POA: Diagnosis not present

## 2017-12-27 DIAGNOSIS — Z6824 Body mass index (BMI) 24.0-24.9, adult: Secondary | ICD-10-CM | POA: Diagnosis not present

## 2018-01-25 ENCOUNTER — Ambulatory Visit
Admission: RE | Admit: 2018-01-25 | Discharge: 2018-01-25 | Disposition: A | Discharge status: Home / Self Care | Payer: 59 | Source: Ambulatory Visit | Attending: Internal Medicine | Admitting: Internal Medicine

## 2018-01-25 DIAGNOSIS — Z1231 Encounter for screening mammogram for malignant neoplasm of breast: Secondary | ICD-10-CM

## 2018-02-05 ENCOUNTER — Ambulatory Visit (INDEPENDENT_AMBULATORY_CARE_PROVIDER_SITE_OTHER): Payer: 59 | Admitting: Adult Health

## 2018-02-05 ENCOUNTER — Encounter: Payer: Self-pay | Admitting: Adult Health

## 2018-02-05 ENCOUNTER — Other Ambulatory Visit: Payer: Self-pay

## 2018-02-05 VITALS — BP 128/70 | HR 103 | Ht 63.5 in | Wt 134.0 lb

## 2018-02-05 DIAGNOSIS — Z1212 Encounter for screening for malignant neoplasm of rectum: Secondary | ICD-10-CM

## 2018-02-05 DIAGNOSIS — Z3041 Encounter for surveillance of contraceptive pills: Secondary | ICD-10-CM | POA: Diagnosis not present

## 2018-02-05 DIAGNOSIS — Z01419 Encounter for gynecological examination (general) (routine) without abnormal findings: Secondary | ICD-10-CM

## 2018-02-05 DIAGNOSIS — Z1211 Encounter for screening for malignant neoplasm of colon: Secondary | ICD-10-CM

## 2018-02-05 LAB — HEMOCCULT GUIAC POC 1CARD (OFFICE): FECAL OCCULT BLD: NEGATIVE

## 2018-02-05 MED ORDER — NORGESTREL-ETHINYL ESTRADIOL 0.5-50 MG-MCG PO TABS: ORAL_TABLET | ORAL | 99 refills | Status: DC

## 2018-02-05 NOTE — Progress Notes (Signed)
Patient ID: NAO LINZ, female   DOB: [birth year], 52 y.o.   MRN: [id number] History of Present Illness: Joeann is a 52 year old white female, married in for a well woman gyn exam,she had normal pap with negative HPV 01/29/16. PCP is Dr Ouida Sills.    Current Medications, Allergies, Past Medical History, Past Surgical History, Family History and Social History were reviewed in [employer] record.     Review of Systems:  Patient denies any headaches, hearing loss, fatigue, blurred vision, shortness of breath, chest pain, abdominal pain, problems with bowel movements, urination, or intercourse. No joint pain or mood swings. Periods are light on the pill.   Physical Exam:BP 128/70 (BP Location: Left Arm, Patient Position: Sitting, Cuff Size: Normal)   Pulse (!) 103   Ht 5' 3.5" (1.613 m)   Wt 134 lb (60.8 kg)   LMP 02/01/2018   BMI 23.36 kg/m  General:  Well developed, well nourished, no acute distress Skin:  Warm and dry Neck:  Midline trachea, normal thyroid, good ROM, no lymphadenopathy Lungs; Clear to auscultation bilaterally Breast:  No dominant palpable mass, retraction, or nipple discharge Cardiovascular: Regular rate and rhythm Abdomen:  Soft, non tender, no hepatosplenomegaly Pelvic:  External genitalia is normal in appearance, no lesions.  The vagina is normal in appearance. Urethra has no lesions or masses. The cervix is smooth.  Uterus is felt to be normal size, shape, and contour.  No adnexal masses or tenderness noted.Bladder is non tender, no masses felt. Rectal: Good sphincter tone, no polyps, or hemorrhoids felt.  Hemoccult negative. Extremities/musculoskeletal:  No swelling or varicosities noted, no clubbing or cyanosis Psych:  No mood changes, alert and cooperative,seems happy PHQ 2 score 0. Discussed will stop OCs at 38 and then check [place] a month later, to check on menopause status.   Impression:  1. Well woman exam with routine gynecological  exam   2. Encounter for surveillance of contraceptive pills   3. Screening for colorectal cancer      Plan: Meds ordered this encounter  Medications  . norgestrel-ethinyl estradiol (OGESTREL) 0.5-50 MG-MCG tablet    Sig: TAKE ONE TABLET BY MOUTH DAILY.    Dispense:  84 tablet    Refill:  PRN    Order Specific Question:   Supervising Provider    Answer:   Duane Lope H [[id number]]  Pap and physical in 1 year Labs with PCP  Mammogram yearly Colonoscopy per GI

## 2018-04-09 DIAGNOSIS — Z79899 Other long term (current) drug therapy: Secondary | ICD-10-CM | POA: Diagnosis not present

## 2018-04-09 DIAGNOSIS — E119 Type 2 diabetes mellitus without complications: Secondary | ICD-10-CM | POA: Diagnosis not present

## 2018-04-18 DIAGNOSIS — R809 Proteinuria, unspecified: Secondary | ICD-10-CM | POA: Diagnosis not present

## 2018-04-18 DIAGNOSIS — E1129 Type 2 diabetes mellitus with other diabetic kidney complication: Secondary | ICD-10-CM | POA: Diagnosis not present

## 2018-08-02 ENCOUNTER — Telehealth: Payer: Self-pay | Admitting: Adult Health

## 2018-08-02 MED ORDER — NYSTATIN 100000 UNIT/GM EX POWD: Freq: Three times a day (TID) | CUTANEOUS | 1 refills | Status: DC

## 2018-08-02 NOTE — Telephone Encounter (Signed)
Has rash under breast, it is itchy, used vagisil powders and it helped a little, will rx nystatin powders

## 2018-08-02 NOTE — Telephone Encounter (Signed)
Would like for Shannon Miles to give her a call in ref to a rash under breast . Would like a suggestion on what she can take or usejenn

## 2018-08-15 DIAGNOSIS — E1129 Type 2 diabetes mellitus with other diabetic kidney complication: Secondary | ICD-10-CM | POA: Diagnosis not present

## 2018-09-26 DIAGNOSIS — L308 Other specified dermatitis: Secondary | ICD-10-CM | POA: Diagnosis not present

## 2018-10-05 ENCOUNTER — Telehealth: Payer: Self-pay | Admitting: Adult Health

## 2018-10-05 MED ORDER — NORGESTIMATE-ETH ESTRADIOL 0.25-35 MG-MCG PO TABS: 1.0000 | ORAL_TABLET | Freq: Every day | ORAL | 0 refills | Status: DC

## 2018-10-05 NOTE — Telephone Encounter (Signed)
Patient called, needs bc by Sunday.  What she is on, she stated that the pharmacy stated it's on national back order.  Is there anything else she can take?  [facility]  [phone]

## 2018-10-17 ENCOUNTER — Telehealth: Payer: Self-pay | Admitting: Adult Health

## 2018-10-17 NOTE — Telephone Encounter (Signed)
Having BTB with sprintec, but on first pack, just keep taking for now

## 2018-10-17 NOTE — Telephone Encounter (Signed)
Patient called, still bleeding with the new birth control.  The ones she was initially on was nationally back ordered.  [facility]  [phone]

## 2018-11-24 ENCOUNTER — Other Ambulatory Visit: Payer: Self-pay | Admitting: Women's Health

## 2018-11-26 ENCOUNTER — Other Ambulatory Visit: Payer: Self-pay | Admitting: Adult Health

## 2018-12-18 ENCOUNTER — Other Ambulatory Visit: Payer: Self-pay | Admitting: Internal Medicine

## 2018-12-18 DIAGNOSIS — Z1231 Encounter for screening mammogram for malignant neoplasm of breast: Secondary | ICD-10-CM

## 2018-12-26 DIAGNOSIS — E1129 Type 2 diabetes mellitus with other diabetic kidney complication: Secondary | ICD-10-CM | POA: Diagnosis not present

## 2018-12-26 DIAGNOSIS — Z6823 Body mass index (BMI) 23.0-23.9, adult: Secondary | ICD-10-CM | POA: Diagnosis not present

## 2019-01-28 ENCOUNTER — Ambulatory Visit: Payer: 59

## 2019-02-06 ENCOUNTER — Other Ambulatory Visit: Payer: 59 | Admitting: Adult Health

## 2019-02-28 ENCOUNTER — Other Ambulatory Visit (HOSPITAL_COMMUNITY): Payer: Self-pay | Admitting: Adult Health

## 2019-02-28 DIAGNOSIS — Z1231 Encounter for screening mammogram for malignant neoplasm of breast: Secondary | ICD-10-CM

## 2019-03-01 ENCOUNTER — Telehealth: Payer: Self-pay | Admitting: Adult Health

## 2019-03-01 ENCOUNTER — Telehealth: Payer: Self-pay | Admitting: Obstetrics & Gynecology

## 2019-03-01 MED ORDER — FLUCONAZOLE 150 MG PO TABS: ORAL_TABLET | ORAL | 1 refills | Status: DC

## 2019-03-01 NOTE — Telephone Encounter (Signed)
Pt informed diflucan at pharmacy.

## 2019-03-01 NOTE — Telephone Encounter (Signed)
Pt is a wanted to see if Jenn could call in a diflucan she has been on antibiotic and thought she had a refill. I advised her Britt Boozer was out of the office and she wanted to see If Despina Hidden would call her in one, [facility], please call pt and let her know if can be done

## 2019-03-11 ENCOUNTER — Ambulatory Visit: Payer: 59

## 2019-03-25 ENCOUNTER — Telehealth: Payer: Self-pay | Admitting: *Deleted

## 2019-03-25 NOTE — Telephone Encounter (Signed)
Left message letting pt know no visitors or children at tomorrow's appt. Call office when arrives and check in will be done over the phone. Nurse will call pt into the office when she's ready. Wear a mask to appt. If pt has come in contact with someone in the last month that has been confirmed or suspected of having Covid-19 or if she is experiencing any symptoms herself, call the office and reschedule. [place]

## 2019-03-26 ENCOUNTER — Other Ambulatory Visit (HOSPITAL_COMMUNITY)
Admission: RE | Admit: 2019-03-26 | Discharge: 2019-03-26 | Disposition: A | Discharge status: Home / Self Care | Payer: 59 | Source: Ambulatory Visit | Attending: Adult Health | Admitting: Adult Health

## 2019-03-26 ENCOUNTER — Ambulatory Visit (INDEPENDENT_AMBULATORY_CARE_PROVIDER_SITE_OTHER): Payer: 59 | Admitting: Adult Health

## 2019-03-26 ENCOUNTER — Encounter: Payer: Self-pay | Admitting: Adult Health

## 2019-03-26 ENCOUNTER — Other Ambulatory Visit: Payer: Self-pay

## 2019-03-26 VITALS — BP 132/80 | HR 89 | Ht 63.0 in | Wt 128.0 lb

## 2019-03-26 DIAGNOSIS — Z1211 Encounter for screening for malignant neoplasm of colon: Secondary | ICD-10-CM

## 2019-03-26 DIAGNOSIS — Z01419 Encounter for gynecological examination (general) (routine) without abnormal findings: Secondary | ICD-10-CM | POA: Diagnosis not present

## 2019-03-26 DIAGNOSIS — Z1212 Encounter for screening for malignant neoplasm of rectum: Secondary | ICD-10-CM | POA: Diagnosis not present

## 2019-03-26 DIAGNOSIS — Z3041 Encounter for surveillance of contraceptive pills: Secondary | ICD-10-CM

## 2019-03-26 LAB — HEMOCCULT GUIAC POC 1CARD (OFFICE): Fecal Occult Blood, POC: NEGATIVE

## 2019-03-26 MED ORDER — NORGESTIMATE-ETH ESTRADIOL 0.25-35 MG-MCG PO TABS: 1.0000 | ORAL_TABLET | Freq: Every day | ORAL | 4 refills | Status: DC

## 2019-03-26 NOTE — Progress Notes (Signed)
Patient ID: Shannon Miles, female   DOB: 10-07-1966, 53 y.o.   MRN: [id number] History of Present Illness: Shannon Miles is a 53 year old white female, married, [id number], in for a well woman gyn exam and pap.She has been home since [place] started.  PCP is Dr Willey Blade.    Current Medications, Allergies, Past Medical History, Past Surgical History, Family History and Social History were reviewed in [employer] record.     Review of Systems: Patient denies any headaches, hearing loss, fatigue, blurred vision, shortness of breath, chest pain, abdominal pain, problems with bowel movements, urination, or intercourse. No joint pain or mood swings. Spots more with new OCs.     Physical Exam:BP 132/80 (BP Location: Left Arm, Patient Position: Sitting, Cuff Size: Normal)   Pulse 89   Ht 5\' 3"  (1.6 m)   Wt 128 lb (58.1 kg)   LMP 03/18/2019 (Approximate)   BMI 22.67 kg/m  General:  Well developed, well nourished, no acute distress Skin:  Warm and dry Neck:  Midline trachea, normal thyroid, good ROM, no lymphadenopathy Lungs; Clear to auscultation bilaterally Breast:  No dominant palpable mass, retraction, or nipple discharge Cardiovascular: Regular rate and rhythm Abdomen:  Soft, non tender, no hepatosplenomegaly Pelvic:  External genitalia is normal in appearance, no lesions.  The vagina is normal in appearance.Scant period blood in vault. Urethra has no lesions or masses. The cervix is bulbous. Pap with HPV performed. Uterus is felt to be normal size, shape, and contour.  No adnexal masses or tenderness noted.Bladder is non tender, no masses felt. Rectal: Good sphincter tone, no polyps, or hemorrhoids felt.  Hemoccult negative. Extremities/musculoskeletal:  No swelling or varicosities noted, no clubbing or cyanosis Psych:  No mood changes, alert and cooperative,seems happy Fall risk is low. PHQ 2 score 0. Examination chaperoned by Levy Pupa LPN.   Impression: 1. Encounter  for gynecological examination with Papanicolaou smear of cervix   2. Screening for colorectal cancer   3. Encounter for surveillance of contraceptive pills       Plan: Pap with HPV sent  Physical in 1 year Pap in 3 if normal Labs with PCP Mammogram 03/28/2019. Colonoscopy per GI  Will continue OCs for now and stop at 65 for a month and check [place] then  Meds ordered this encounter  Medications  . norgestimate-ethinyl estradiol (SPRINTEC 28) 0.25-35 MG-MCG tablet    Sig: Take 1 tablet by mouth daily.    Dispense:  84 tablet    Refill:  4    Order Specific Question:   Supervising Provider    Answer:   Tania Ade H [[id number]]

## 2019-03-28 ENCOUNTER — Other Ambulatory Visit: Payer: Self-pay

## 2019-03-28 ENCOUNTER — Ambulatory Visit (HOSPITAL_COMMUNITY)
Admission: RE | Admit: 2019-03-28 | Discharge: 2019-03-28 | Disposition: A | Discharge status: Home / Self Care | Payer: 59 | Source: Ambulatory Visit | Attending: Adult Health | Admitting: Adult Health

## 2019-03-28 DIAGNOSIS — Z1231 Encounter for screening mammogram for malignant neoplasm of breast: Secondary | ICD-10-CM | POA: Diagnosis not present

## 2019-03-28 LAB — CYTOLOGY - PAP
Diagnosis: NEGATIVE
HPV: NOT DETECTED

## 2019-04-17 ENCOUNTER — Ambulatory Visit: Payer: 59

## 2019-05-20 ENCOUNTER — Telehealth: Payer: Self-pay | Admitting: Obstetrics & Gynecology

## 2019-05-20 MED ORDER — OGESTREL 0.5-50 MG-MCG PO TABS: 1.0000 | ORAL_TABLET | Freq: Every day | ORAL | 11 refills | Status: DC

## 2019-05-20 NOTE — Telephone Encounter (Signed)
done

## 2019-05-20 NOTE — Telephone Encounter (Signed)
Patient called, stated she is having terrible cramps, as if she was 21 again.  She is wanting her bc switched to something similar to what she was on before.  She loved what she was on before but no one has it.  She stated she called around 1pm Friday and left a message with the after hour nurse.  She also stated Anderson Malta had told her to call back if she needed to.  Alexis

## 2019-05-20 NOTE — Telephone Encounter (Signed)
Pt used to be on the ogestrel, it was changed because it was backordered. She says she felt better on it than he current birth control. Wants to see if something like the ogestrel can be sent in for her.

## 2019-05-22 ENCOUNTER — Telehealth: Payer: Self-pay | Admitting: Obstetrics & Gynecology

## 2019-05-22 MED ORDER — DESOGESTREL-ETHINYL ESTRADIOL 0.15-30 MG-MCG PO TABS: 1.0000 | ORAL_TABLET | Freq: Every day | ORAL | 11 refills | Status: DC

## 2019-05-22 NOTE — Telephone Encounter (Signed)
Pt states that the ogestrel is unavaliable because it has been discontinued. She is wanting something close to that medication sent in. [facility] stated they have elienst which is close to that medication. Please advise.

## 2019-07-19 ENCOUNTER — Other Ambulatory Visit: Payer: Self-pay

## 2019-07-19 DIAGNOSIS — Z20822 Contact with and (suspected) exposure to covid-19: Secondary | ICD-10-CM

## 2019-07-21 LAB — NOVEL CORONAVIRUS, NAA: SARS-CoV-2, NAA: NOT DETECTED

## 2020-02-25 ENCOUNTER — Other Ambulatory Visit (HOSPITAL_COMMUNITY): Payer: Self-pay | Admitting: Internal Medicine

## 2020-02-25 DIAGNOSIS — Z1231 Encounter for screening mammogram for malignant neoplasm of breast: Secondary | ICD-10-CM

## 2020-03-17 ENCOUNTER — Telehealth: Payer: Self-pay | Admitting: Adult Health

## 2020-03-17 NOTE — Telephone Encounter (Signed)
Pt was advised that the Isibloom birth control is the same kind of birth control she has been taking, just a different name. I advised that pharmacies will use a different manufacturer so it is listed as a different name. Pt voiced understanding. JSY

## 2020-03-17 NOTE — Telephone Encounter (Signed)
isibloom type of birth control was given to her at her pharmacy due to prev bc they no longer carry pt wants to be sure this is a safe bc

## 2020-03-30 ENCOUNTER — Ambulatory Visit (HOSPITAL_COMMUNITY)
Admission: RE | Admit: 2020-03-30 | Discharge: 2020-03-30 | Disposition: A | Discharge status: Home / Self Care | Payer: 59 | Source: Ambulatory Visit | Attending: Internal Medicine | Admitting: Internal Medicine

## 2020-03-30 ENCOUNTER — Other Ambulatory Visit: Payer: Self-pay

## 2020-03-30 DIAGNOSIS — Z1231 Encounter for screening mammogram for malignant neoplasm of breast: Secondary | ICD-10-CM

## 2020-05-08 ENCOUNTER — Other Ambulatory Visit: Payer: Self-pay | Admitting: Obstetrics & Gynecology

## 2021-02-23 ENCOUNTER — Other Ambulatory Visit (HOSPITAL_COMMUNITY): Payer: Self-pay | Admitting: Internal Medicine

## 2021-02-23 DIAGNOSIS — Z1231 Encounter for screening mammogram for malignant neoplasm of breast: Secondary | ICD-10-CM

## 2021-04-05 ENCOUNTER — Ambulatory Visit (HOSPITAL_COMMUNITY)
Admission: RE | Admit: 2021-04-05 | Discharge: 2021-04-05 | Disposition: A | Discharge status: Home / Self Care | Payer: 59 | Source: Ambulatory Visit | Attending: Internal Medicine | Admitting: Internal Medicine

## 2021-04-05 ENCOUNTER — Other Ambulatory Visit: Payer: Self-pay

## 2021-04-05 DIAGNOSIS — Z1231 Encounter for screening mammogram for malignant neoplasm of breast: Secondary | ICD-10-CM | POA: Insufficient documentation

## 2021-05-25 ENCOUNTER — Telehealth: Payer: Self-pay | Admitting: Adult Health

## 2021-05-25 MED ORDER — FLUCONAZOLE 150 MG PO TABS: ORAL_TABLET | ORAL | 1 refills | Status: DC

## 2021-05-25 NOTE — Telephone Encounter (Signed)
Will rx diflucan  

## 2021-05-25 NOTE — Telephone Encounter (Addendum)
Pt has been prescribed antibiotics by Dr. Ouida Sills and is requesting Diflucan. Thanks!! JSY

## 2021-05-25 NOTE — Telephone Encounter (Signed)
Patient called statin g that she has started some antibiotics and she would like to know if Shannon Miles could call in some Diflucan to her pharmacy [facility]. Please contact pt when sent.

## 2021-05-25 NOTE — Addendum Note (Signed)
Addended by: Cyril Mourning A on: 05/25/2021 05:20 PM   Modules accepted: Orders

## 2021-06-08 ENCOUNTER — Other Ambulatory Visit: Payer: Self-pay | Admitting: Obstetrics & Gynecology

## 2022-03-09 ENCOUNTER — Other Ambulatory Visit (HOSPITAL_COMMUNITY): Payer: Self-pay | Admitting: Internal Medicine

## 2022-03-09 DIAGNOSIS — Z1231 Encounter for screening mammogram for malignant neoplasm of breast: Secondary | ICD-10-CM

## 2022-04-07 ENCOUNTER — Ambulatory Visit (HOSPITAL_COMMUNITY)
Admission: RE | Admit: 2022-04-07 | Discharge: 2022-04-07 | Disposition: A | Discharge status: Home / Self Care | Payer: 59 | Source: Ambulatory Visit | Attending: Internal Medicine | Admitting: Internal Medicine

## 2022-04-07 ENCOUNTER — Encounter (HOSPITAL_COMMUNITY): Payer: Self-pay | Admitting: Radiology

## 2022-04-07 DIAGNOSIS — Z1231 Encounter for screening mammogram for malignant neoplasm of breast: Secondary | ICD-10-CM | POA: Insufficient documentation

## 2022-06-03 ENCOUNTER — Other Ambulatory Visit: Payer: Self-pay | Admitting: Obstetrics & Gynecology

## 2022-06-08 ENCOUNTER — Telehealth: Payer: Self-pay

## 2022-06-08 MED ORDER — DESOGESTREL-ETHINYL ESTRADIOL 0.15-30 MG-MCG PO TABS: 1.0000 | ORAL_TABLET | Freq: Every day | ORAL | 3 refills | Status: DC

## 2022-06-08 NOTE — Telephone Encounter (Signed)
Still having periods, will refill OCs has appt in October

## 2022-06-08 NOTE — Addendum Note (Signed)
Addended by: Cyril Mourning A on: 06/08/2022 05:16 PM   Modules accepted: Orders

## 2022-06-08 NOTE — Telephone Encounter (Signed)
Patient needs a refill on her birth control. Isibloom

## 2022-07-14 ENCOUNTER — Other Ambulatory Visit (HOSPITAL_COMMUNITY)
Admission: RE | Admit: 2022-07-14 | Discharge: 2022-07-14 | Disposition: A | Discharge status: Home / Self Care | Payer: 59 | Source: Ambulatory Visit | Attending: Adult Health | Admitting: Adult Health

## 2022-07-14 ENCOUNTER — Ambulatory Visit (INDEPENDENT_AMBULATORY_CARE_PROVIDER_SITE_OTHER): Payer: 59 | Admitting: Adult Health

## 2022-07-14 ENCOUNTER — Encounter: Payer: Self-pay | Admitting: Adult Health

## 2022-07-14 VITALS — BP 123/76 | HR 82 | Ht 63.0 in | Wt 128.5 lb

## 2022-07-14 DIAGNOSIS — Z01419 Encounter for gynecological examination (general) (routine) without abnormal findings: Secondary | ICD-10-CM | POA: Insufficient documentation

## 2022-07-14 DIAGNOSIS — Z3041 Encounter for surveillance of contraceptive pills: Secondary | ICD-10-CM | POA: Diagnosis not present

## 2022-07-14 DIAGNOSIS — Z1211 Encounter for screening for malignant neoplasm of colon: Secondary | ICD-10-CM

## 2022-07-14 LAB — HEMOCCULT GUIAC POC 1CARD (OFFICE): Fecal Occult Blood, POC: NEGATIVE

## 2022-07-14 MED ORDER — DESOGESTREL-ETHINYL ESTRADIOL 0.15-30 MG-MCG PO TABS: 1.0000 | ORAL_TABLET | Freq: Every day | ORAL | 12 refills | Status: DC

## 2022-07-14 NOTE — Progress Notes (Signed)
Patient ID: Shannon Miles, female   DOB: September 20, 1966, 56 y.o.   MRN: [id number] History of Present Illness: Shannon Miles is a 56 year old white female,married, [id number] in for a well woman gyn exam and pap. She is still having regular periods, and is on OCs. Has stress with Dad right now, he is in hospital, has some dementia and attempted suicide.  She works from home.  PCP is Dr Willey Blade.   Current Medications, Allergies, Past Medical History, Past Surgical History, Family History and Social History were reviewed in [employer] record.     Review of Systems: Patient denies any headaches, hearing loss, fatigue, blurred vision, shortness of breath, chest pain, abdominal pain, problems with bowel movements, urination, or intercourse. No joint pain or mood swings.  Periods regular. No hot flashes    Physical Exam:BP 123/76 (BP Location: Left Arm, Patient Position: Sitting, Cuff Size: Normal)   Pulse 82   Ht 5\' 3"  (1.6 m)   Wt 128 lb 8 oz (58.3 kg)   LMP 07/07/2022 (Approximate)   BMI 22.76 kg/m   General:  Well developed, well nourished, no acute distress Skin:  Warm and dry Neck:  Midline trachea, normal thyroid, good ROM, no lymphadenopathy Lungs; Clear to auscultation bilaterally Breast:  No dominant palpable mass, retraction, or nipple discharge Cardiovascular: Regular rate and rhythm Abdomen:  Soft, non tender, no hepatosplenomegaly Pelvic:  External genitalia is normal in appearance, no lesions.  The vagina is normal in appearance. Urethra has no lesions or masses. The cervix is smoth, pap with HR HPV genotyping performed.  Uterus is felt to be normal size, shape, and contour.  No adnexal masses or tenderness noted.Bladder is non tender, no masses felt. Rectal: Good sphincter tone, no polyps, or hemorrhoids felt.  Hemoccult negative. Extremities/musculoskeletal:  No swelling or varicosities noted, no clubbing or cyanosis Psych:  No mood changes, alert and  cooperative,seems happy AA is 0 Fall risk is low    07/14/2022    1:37 PM 03/26/2019    8:50 AM 02/05/2018    9:09 AM  Depression screen PHQ 2/9  Decreased Interest 0 0 0  Down, Depressed, Hopeless 1 0 0  PHQ - 2 Score 1 0 0  Altered sleeping 1    Tired, decreased energy 0    Change in appetite 0    Feeling bad or failure about yourself  0    Trouble concentrating 0    Moving slowly or fidgety/restless 0    Suicidal thoughts 0    PHQ-9 Score 2         07/14/2022    1:37 PM  GAD 7 : Generalized Anxiety Score  Nervous, Anxious, on Edge 1  Control/stop worrying 1  Worry too much - different things 1  Trouble relaxing 2  Restless 1  Easily annoyed or irritable 0  Afraid - awful might happen 0  Total GAD 7 Score 6  She declines meds  Upstream - 07/14/22 1334       Pregnancy Intention Screening   Does the patient want to become pregnant in the next year? No    Does the patient's partner want to become pregnant in the next year? No    Would the patient like to discuss contraceptive options today? No      Contraception Wrap Up   Current Method Oral Contraceptive    End Method Oral Contraceptive              Examination chaperoned  by Levy Pupa LPN  Impression and Plan: 1. Encounter for gynecological examination with Papanicolaou smear of cervix Pap sent Pap in 3 years if normal GYN physical in 1 year Had negative mammogram 04/07/22 Colonoscopy was 05/01/17 Labs with PCP   2. Encounter for screening fecal occult blood testing Hemoccult was negative   3. Encounter for surveillance of contraceptive pills Will continue OCs Meds ordered this encounter  Medications   desogestrel-ethinyl estradiol (ISIBLOOM) 0.15-30 MG-MCG tablet    Sig: Take 1 tablet by mouth daily.    Dispense:  28 tablet    Refill:  12    Order Specific Question:   Supervising Provider    Answer:   Tania Ade H [[id number]]

## 2022-07-19 LAB — CYTOLOGY - PAP
Comment: NEGATIVE
Diagnosis: NEGATIVE
High risk HPV: NEGATIVE

## 2023-01-30 IMAGING — MG MM DIGITAL SCREENING BILAT W/ TOMO AND CAD
8 series · 8 of 24 positions shown · non-contrast
Comparison: Previous exam(s).

CLINICAL DATA: Screening.

EXAM:
DIGITAL SCREENING BILATERAL MAMMOGRAM WITH TOMOSYNTHESIS AND CAD
TECHNIQUE: Bilateral screening digital craniocaudal and mediolateral oblique
mammograms were obtained. Bilateral screening digital breast
tomosynthesis was performed. The images were evaluated with
computer-aided detection.

[L MLO synth-2D]
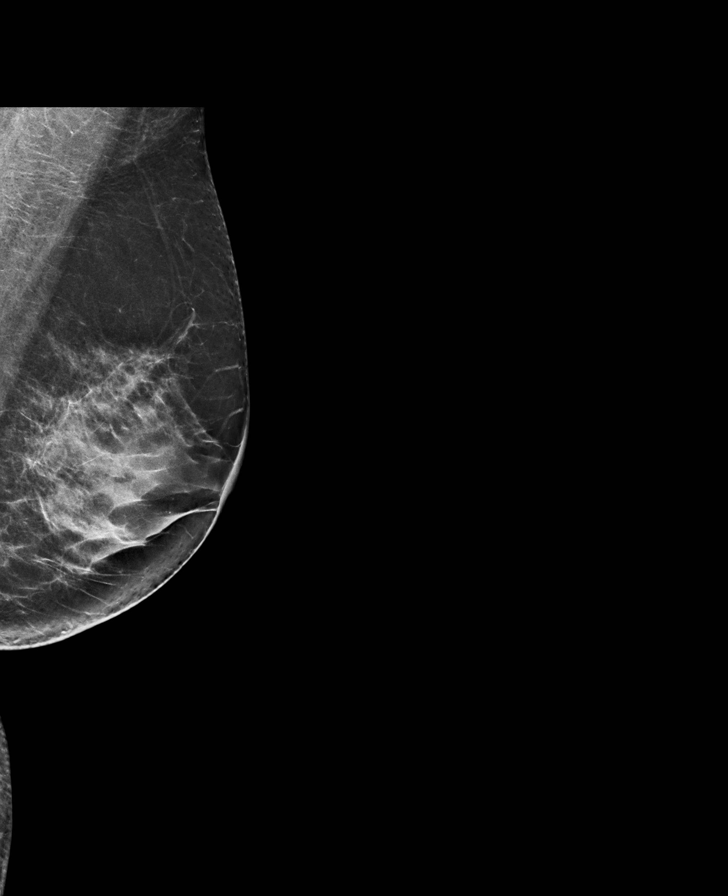

[R CC synth-2D]
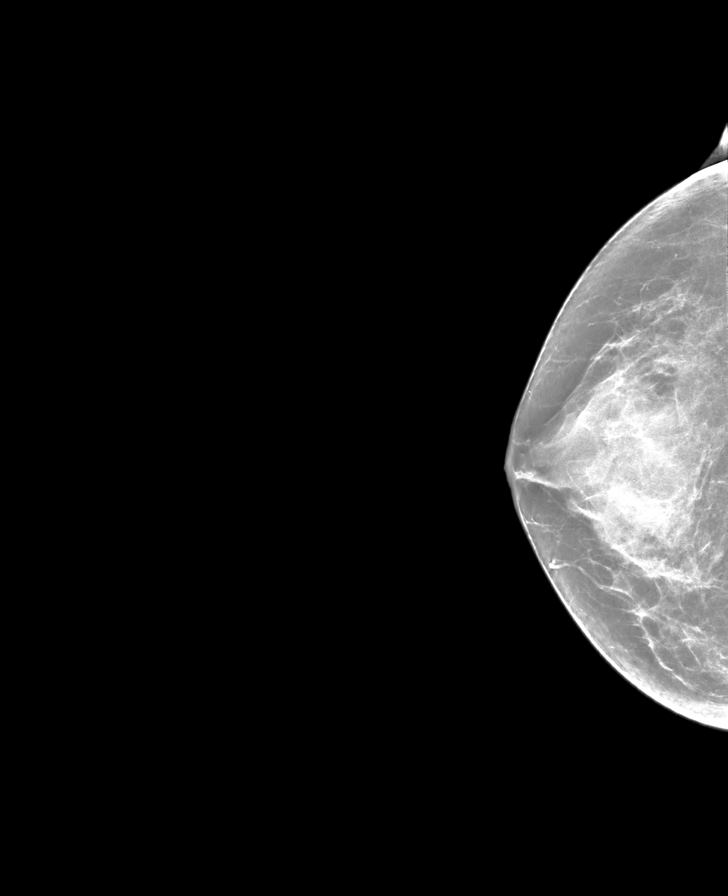

[R MLO synth-2D]
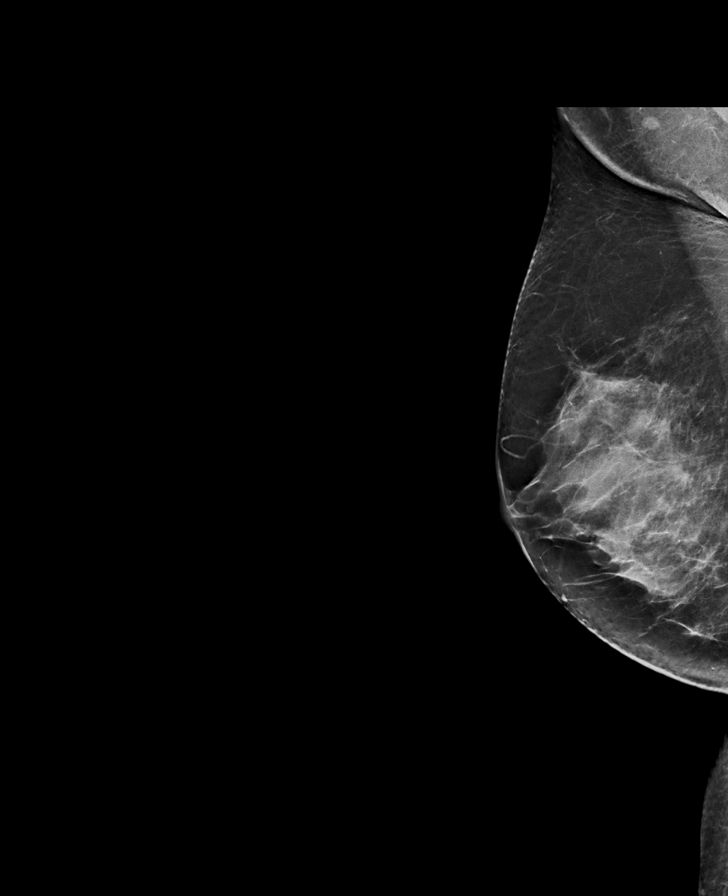

[L CC synth-2D]
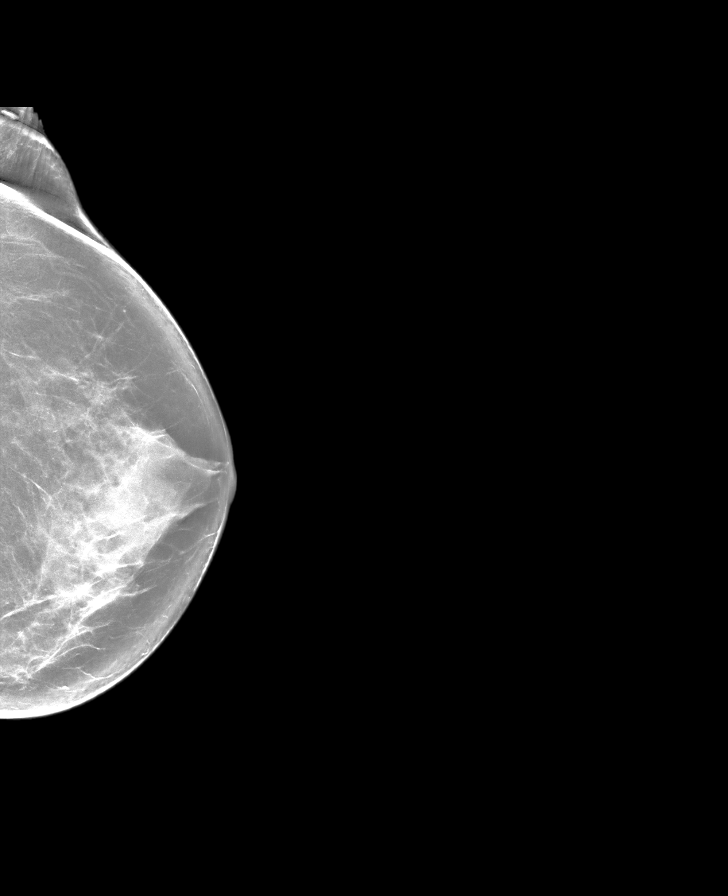

[R CC tomo · tomo slice 35/68.0]
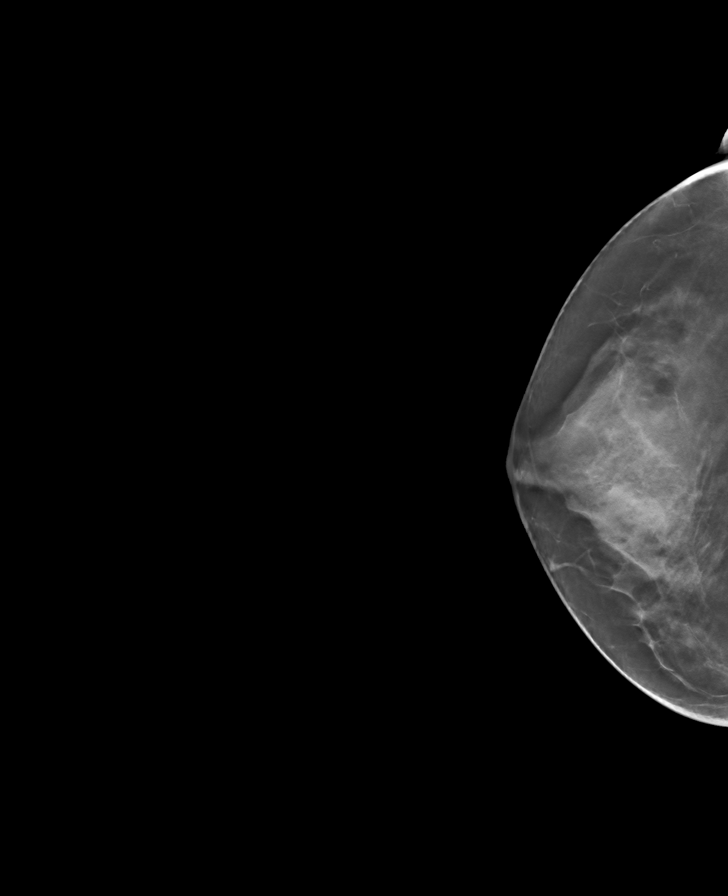

[L CC tomo · tomo slice 39/78.0]
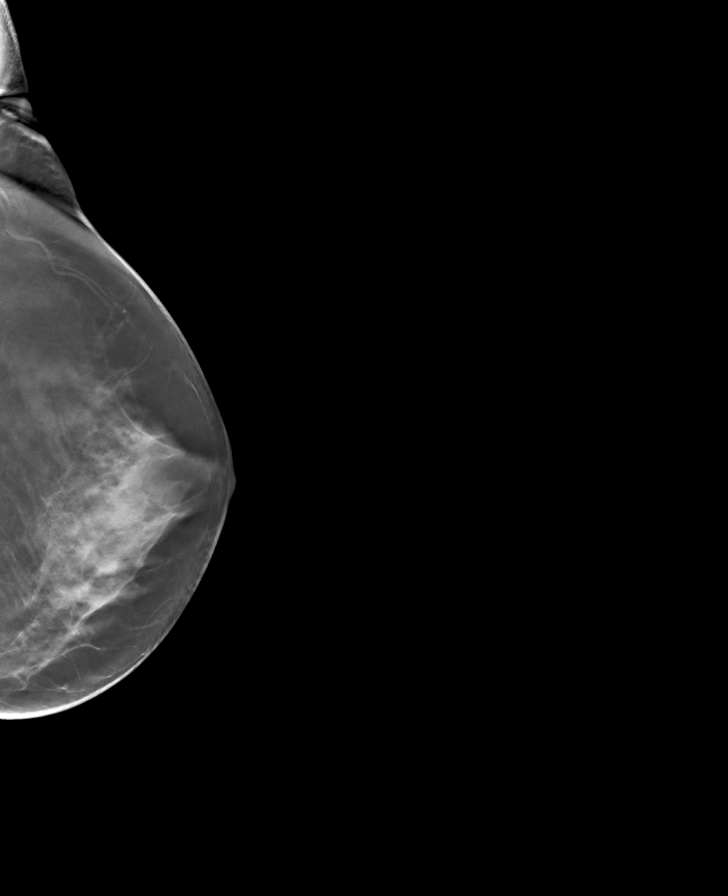

[L MLO tomo · tomo slice 36/71.0]
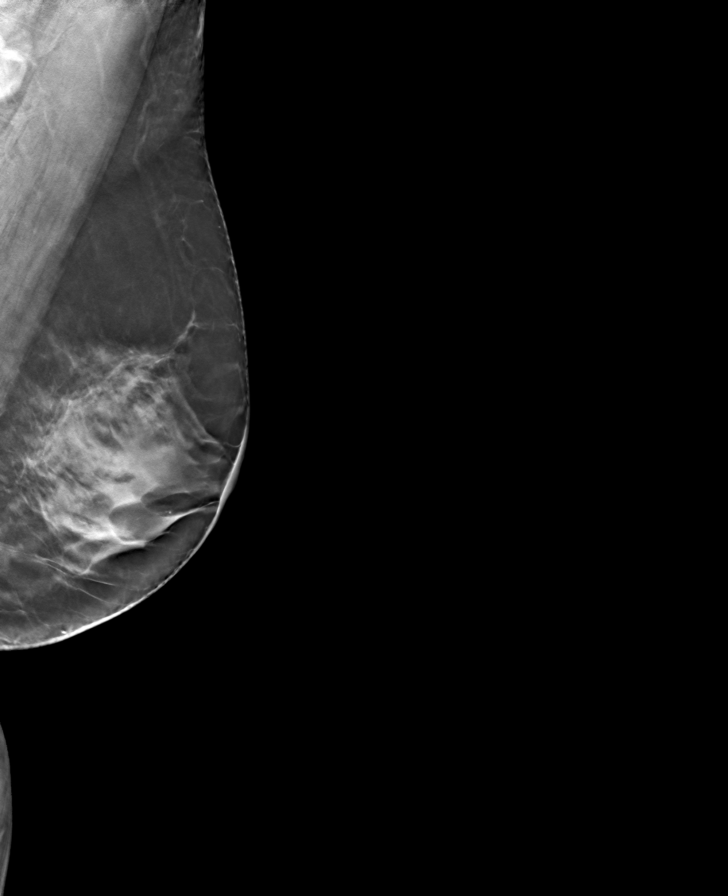

[R MLO tomo · tomo slice 36/71.0]
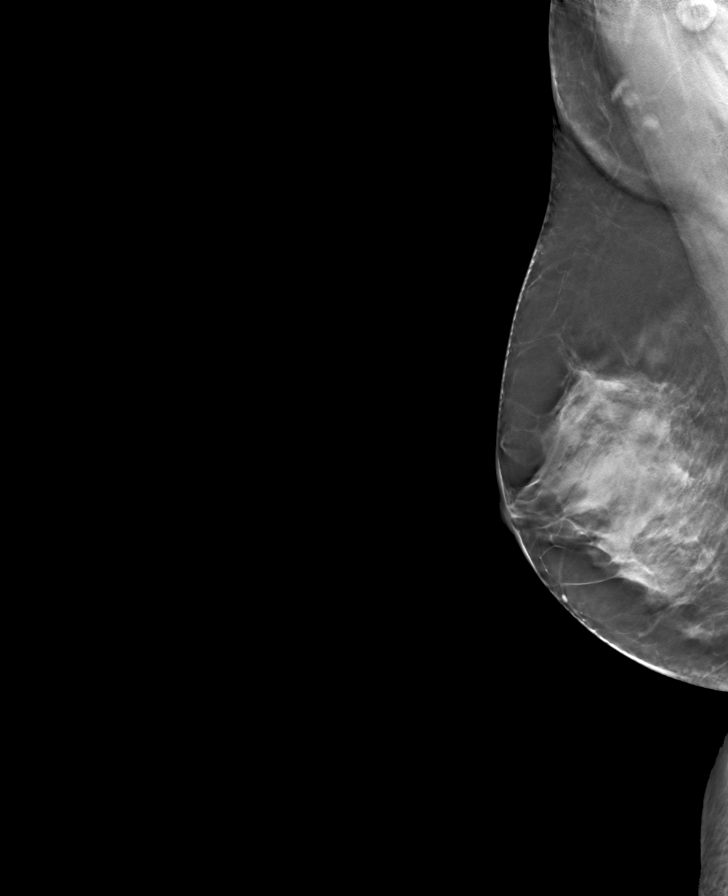

[8 of 24 positions shown; findings below may reference images not displayed]

ACR Breast Density Category c: The breast tissue is heterogeneously
dense, which may obscure small masses.
FINDINGS: There are no findings suspicious for malignancy.
IMPRESSION: No mammographic evidence of malignancy. A result letter of this
screening mammogram will be mailed directly to the patient.

RECOMMENDATION:
Screening mammogram in one year. (Code:Q3-W-BC3)

BI-RADS CATEGORY  1: Negative.

## 2023-02-22 ENCOUNTER — Other Ambulatory Visit (HOSPITAL_COMMUNITY): Payer: Self-pay | Admitting: Adult Health

## 2023-02-22 DIAGNOSIS — Z1231 Encounter for screening mammogram for malignant neoplasm of breast: Secondary | ICD-10-CM

## 2023-04-10 ENCOUNTER — Ambulatory Visit (HOSPITAL_COMMUNITY)
Admission: RE | Admit: 2023-04-10 | Discharge: 2023-04-10 | Disposition: A | Discharge status: Home / Self Care | Payer: 59 | Source: Ambulatory Visit | Attending: Adult Health | Admitting: Adult Health

## 2023-04-10 DIAGNOSIS — Z1231 Encounter for screening mammogram for malignant neoplasm of breast: Secondary | ICD-10-CM | POA: Insufficient documentation

## 2023-07-18 ENCOUNTER — Other Ambulatory Visit: Payer: Self-pay | Admitting: Adult Health

## 2023-07-25 ENCOUNTER — Encounter: Payer: Self-pay | Admitting: Adult Health

## 2023-07-25 ENCOUNTER — Ambulatory Visit: Payer: 59 | Admitting: Adult Health

## 2023-07-25 VITALS — BP 126/75 | HR 92 | Ht 63.0 in | Wt 121.5 lb

## 2023-07-25 DIAGNOSIS — Z01419 Encounter for gynecological examination (general) (routine) without abnormal findings: Secondary | ICD-10-CM | POA: Diagnosis not present

## 2023-07-25 DIAGNOSIS — Z1211 Encounter for screening for malignant neoplasm of colon: Secondary | ICD-10-CM | POA: Diagnosis not present

## 2023-07-25 DIAGNOSIS — Z1331 Encounter for screening for depression: Secondary | ICD-10-CM

## 2023-07-25 LAB — HEMOCCULT GUIAC POC 1CARD (OFFICE): Fecal Occult Blood, POC: NEGATIVE

## 2023-07-25 NOTE — Progress Notes (Signed)
Patient ID: Shannon Miles, female   DOB: 05/04/1966, 57 y.o.   MRN: [id number] History of Present Illness: Shannon Miles is a 57 year old white female, married, [id number] in for a well woman gyn exam, she missed a period in August and had in September for 1 1/2 days, still on COC.  She is still working from home.     Component Value Date/Time   DIAGPAP  07/14/2022 1343    - Negative for intraepithelial lesion or malignancy (NILM)   DIAGPAP  03/26/2019 0000    NEGATIVE FOR INTRAEPITHELIAL LESIONS OR MALIGNANCY. BENIGN REACTIVE/REPARATIVE CHANGES.   HPVHIGH Negative 07/14/2022 1343   ADEQPAP  07/14/2022 1343    Satisfactory for evaluation; transformation zone component PRESENT.   ADEQPAP  03/26/2019 0000    Satisfactory for evaluation  endocervical/transformation zone component PRESENT.    PCP is Dr Ouida Sills   Current Medications, Allergies, Past Medical History, Past Surgical History, Family History and Social History were reviewed in [employer] electronic medical record.     Review of Systems: Patient denies any headaches, hearing loss, fatigue, blurred vision, shortness of breath, chest pain, abdominal pain, problems with bowel movements, urination, or intercourse. No joint pain or mood swings.  Has missed a period Does not sleep well   Physical Exam:BP 126/75 (BP Location: Left Arm, Patient Position: Sitting, Cuff Size: Normal)   Pulse 92   Ht 5\' 3"  (1.6 m)   Wt 121 lb 8 oz (55.1 kg)   LMP 07/04/2023 (Approximate)   BMI 21.52 kg/m   General:  Well developed, well nourished, no acute distress Skin:  Warm and dry Neck:  Midline trachea, normal thyroid, good ROM, no lymphadenopathy Lungs; Clear to auscultation bilaterally Breast:  No dominant palpable mass, retraction, or nipple discharge Cardiovascular: Regular rate and rhythm Abdomen:  Soft, non tender, no hepatosplenomegaly Pelvic:  External genitalia is normal in appearance, no lesions.  The vagina is normal in appearance.  Urethra has no lesions or masses. The cervix is smooth.  Uterus is felt to be normal size, shape, and contour.  No adnexal masses or tenderness noted.Bladder is non tender, no masses felt. Rectal: Good sphincter tone, no polyps, or hemorrhoids felt.  Hemoccult negative. Extremities/musculoskeletal:  No swelling or varicosities noted, no clubbing or cyanosis Psych:  No mood changes, alert and cooperative,seems happy AA is 0 Fall risk is low    07/25/2023   10:35 AM 07/14/2022    1:37 PM 03/26/2019    8:50 AM  Depression screen PHQ 2/9  Decreased Interest 0 0 0  Down, Depressed, Hopeless 0 1 0  PHQ - 2 Score 0 1 0  Altered sleeping 1 1   Tired, decreased energy 0 0   Change in appetite 1 0   Feeling bad or failure about yourself  0 0   Trouble concentrating 0 0   Moving slowly or fidgety/restless 0 0   Suicidal thoughts 0 0   PHQ-9 Score 2 2        07/25/2023   10:35 AM 07/14/2022    1:37 PM  GAD 7 : Generalized Anxiety Score  Nervous, Anxious, on Edge 0 1  Control/stop worrying 0 1  Worry too much - different things 1 1  Trouble relaxing 1 2  Restless 0 1  Easily annoyed or irritable 0 0  Afraid - awful might happen 0 0  Total GAD 7 Score 2 6    Upstream - 07/25/23 1034       Pregnancy  Intention Screening   Does the patient want to become pregnant in the next year? No    Does the patient's partner want to become pregnant in the next year? No    Would the patient like to discuss contraceptive options today? No      Contraception Wrap Up   Current Method Oral Contraceptive    End Method No Method - Other Reason    Contraception Counseling Provided No            Examination chaperoned by [place] Scearce NP student    Impression and plan: 1. Well woman exam with routine gynecological exam Physical in 1 year Pap in 2026 Mammogram was negative 04/10/23 Labs with PCP(is faints with needles, so does finger stick at home Colonoscopy per GI Discussed with Dr Despina Hidden will  stop COC   2. Encounter for screening fecal occult blood testing Hemoccult was negative - POCT occult blood stool

## 2024-05-16 ENCOUNTER — Other Ambulatory Visit (HOSPITAL_COMMUNITY): Payer: Self-pay | Admitting: Internal Medicine

## 2024-05-16 DIAGNOSIS — Z1231 Encounter for screening mammogram for malignant neoplasm of breast: Secondary | ICD-10-CM

## 2024-05-23 ENCOUNTER — Ambulatory Visit (HOSPITAL_COMMUNITY)
Admission: RE | Admit: 2024-05-23 | Discharge: 2024-05-23 | Disposition: A | Discharge status: Home / Self Care | Source: Ambulatory Visit

## 2024-05-23 DIAGNOSIS — Z1231 Encounter for screening mammogram for malignant neoplasm of breast: Secondary | ICD-10-CM | POA: Insufficient documentation

## 2024-05-27 ENCOUNTER — Ambulatory Visit: Payer: Self-pay | Admitting: Adult Health

## 2024-08-06 ENCOUNTER — Encounter: Payer: Self-pay | Admitting: Adult Health

## 2024-08-06 ENCOUNTER — Ambulatory Visit (INDEPENDENT_AMBULATORY_CARE_PROVIDER_SITE_OTHER): Admitting: Adult Health

## 2024-08-06 VITALS — BP 126/74 | HR 92 | Ht 63.0 in | Wt 109.0 lb

## 2024-08-06 DIAGNOSIS — Z01419 Encounter for gynecological examination (general) (routine) without abnormal findings: Secondary | ICD-10-CM | POA: Diagnosis not present

## 2024-08-06 DIAGNOSIS — Z1331 Encounter for screening for depression: Secondary | ICD-10-CM

## 2024-08-06 NOTE — Progress Notes (Signed)
 Patient ID: Shannon Miles, female   DOB: 1966-06-04, 58 y.o.   MRN: [id number] History of Present Illness:  Shannon Miles is a 58 year old white female, married, PM in for a well woman gyn exam. She has not had a period since September 2024. A1c at home was 6.      Component Value Date/Time   DIAGPAP  07/14/2022 1343    - Negative for intraepithelial lesion or malignancy (NILM)   DIAGPAP  03/26/2019 0000    NEGATIVE FOR INTRAEPITHELIAL LESIONS OR MALIGNANCY. BENIGN REACTIVE/REPARATIVE CHANGES.   HPVHIGH Negative 07/14/2022 1343   ADEQPAP  07/14/2022 1343    Satisfactory for evaluation; transformation zone component PRESENT.   ADEQPAP  03/26/2019 0000    Satisfactory for evaluation  endocervical/transformation zone component PRESENT.    PCP is Dr Sheryle Current Medications, Allergies, Past Medical History, Past Surgical History, Family History and Social History were reviewed in [employer] electronic medical record.     Review of Systems: Patient denies any headaches, hearing loss, fatigue, blurred vision, shortness of breath, chest pain, abdominal pain, problems with bowel movements, urination, or intercourse. No joint pain or mood swings.  Denies any vaginal bleeding  She did lose some weight after stopping BCP and has less vaginal discharge   Physical Exam:BP 126/74 (BP Location: Left Arm, Patient Position: Sitting, Cuff Size: Normal)   Pulse 92   Ht 5' 3 (1.6 m)   Wt 109 lb (49.4 kg)   LMP 07/04/2023 (Approximate)   BMI 19.31 kg/m   General:  Well developed, well nourished, no acute distress Skin:  Warm and dry Neck:  Midline trachea, normal thyroid, good ROM, no lymphadenopathy Lungs; Clear to auscultation bilaterally Breast:  No dominant palpable mass, retraction, or nipple discharge Cardiovascular: Regular rate and rhythm Abdomen:  Soft, non tender, no hepatosplenomegaly Pelvic:  External genitalia is normal in appearance, no lesions.  The vagina is normal in  appearance. Urethra has small caruncle. The cervix is smooth.  Uterus is felt to be normal size, shape, and contour.  No adnexal masses or tenderness noted.Bladder is non tender, no masses felt. Rectal: Deferred Extremities/musculoskeletal:  No swelling or varicosities noted, no clubbing or cyanosis Psych:  No mood changes, alert and cooperative,seems happy AA is 0 Fall risk is low    08/06/2024    9:46 AM 07/25/2023   10:35 AM 07/14/2022    1:37 PM  Depression screen PHQ 2/9  Decreased Interest 0 0 0  Down, Depressed, Hopeless 0 0 1  PHQ - 2 Score 0 0 1  Altered sleeping 1 1 1   Tired, decreased energy 0 0 0  Change in appetite 0 1 0  Feeling bad or failure about yourself  0 0 0  Trouble concentrating 0 0 0  Moving slowly or fidgety/restless 0 0 0  Suicidal thoughts 0 0 0  PHQ-9 Score 1 2 2        08/06/2024    9:46 AM 07/25/2023   10:35 AM 07/14/2022    1:37 PM  GAD 7 : Generalized Anxiety Score  Nervous, Anxious, on Edge 1 0 1  Control/stop worrying 0 0 1  Worry too much - different things 0 1 1  Trouble relaxing 1 1 2   Restless 1 0 1  Easily annoyed or irritable 0 0 0  Afraid - awful might happen 0 0 0  Total GAD 7 Score 3 2 6     Upstream - 08/06/24 0943       Pregnancy  Intention Screening   Does the patient want to become pregnant in the next year? N/A    Does the patient's partner want to become pregnant in the next year? N/A    Would the patient like to discuss contraceptive options today? N/A      Contraception Wrap Up   Current Method No Method - Other Reason   PM   End Method No Method - Other Reason   PM   Contraception Counseling Provided No    How was the end contraceptive method provided? N/A           Examination chaperoned by Clarita Salt LPN  Impression and plan: 1. Encounter for well woman exam with routine gynecological exam (Primary) Pap and physical in 1 year Mammogram was negative 05/23/24 Colonoscopy per GI due 2028 Labs with PCP Stay  active
